# Patient Record
Sex: Female | Born: 1970 | Race: Asian | Hispanic: No | Marital: Married | Smoking: Never smoker
Health system: Southern US, Community
[De-identification: ages and names within clinical notes are randomized; demographics above are authoritative.]

## PROBLEM LIST (undated history)

## (undated) DIAGNOSIS — E785 Hyperlipidemia, unspecified: Secondary | ICD-10-CM

## (undated) DIAGNOSIS — C801 Malignant (primary) neoplasm, unspecified: Secondary | ICD-10-CM

## (undated) DIAGNOSIS — K649 Unspecified hemorrhoids: Secondary | ICD-10-CM

## (undated) DIAGNOSIS — Z800 Family history of malignant neoplasm of digestive organs: Secondary | ICD-10-CM

## (undated) HISTORY — PX: NO PAST SURGERIES: SHX2092

## (undated) HISTORY — DX: Hyperlipidemia, unspecified: E78.5

## (undated) HISTORY — DX: Family history of malignant neoplasm of digestive organs: Z80.0

---

## 2002-12-28 ENCOUNTER — Inpatient Hospital Stay (HOSPITAL_COMMUNITY): Admission: AD | Admit: 2002-12-28 | Discharge: 2002-12-31 | Payer: Self-pay | Admitting: Obstetrics

## 2004-07-14 ENCOUNTER — Ambulatory Visit (HOSPITAL_COMMUNITY): Admission: RE | Admit: 2004-07-14 | Discharge: 2004-07-14 | Payer: Self-pay | Admitting: Obstetrics

## 2005-01-15 ENCOUNTER — Inpatient Hospital Stay (HOSPITAL_COMMUNITY): Admission: AD | Admit: 2005-01-15 | Discharge: 2005-01-18 | Payer: Self-pay | Admitting: Obstetrics

## 2010-02-18 ENCOUNTER — Ambulatory Visit: Payer: Self-pay | Admitting: Internal Medicine

## 2010-02-18 LAB — CONVERTED CEMR LAB
AST: 21 units/L (ref 0–37)
Albumin: 3.9 g/dL (ref 3.5–5.2)
Bilirubin Urine: NEGATIVE
Calcium: 8.9 mg/dL (ref 8.4–10.5)
Cholesterol: 276 mg/dL — ABNORMAL HIGH (ref 0–200)
Creatinine, Ser: 0.7 mg/dL (ref 0.4–1.2)
Eosinophils Relative: 1.7 % (ref 0.0–5.0)
Glucose, Bld: 88 mg/dL (ref 70–99)
HDL: 65.4 mg/dL (ref >39.00)
Ketones, ur: NEGATIVE mg/dL
Leukocytes, UA: NEGATIVE
Lymphocytes Relative: 41 % (ref 12.0–46.0)
Monocytes Relative: 7.5 % (ref 3.0–12.0)
Neutrophils Relative %: 49.1 % (ref 43.0–77.0)
Platelets: 346 10*3/uL (ref 150.0–400.0)
Potassium: 4 meq/L (ref 3.5–5.1)
Sodium: 137 meq/L (ref 135–145)
Specific Gravity, Urine: 1.02 (ref 1.000–1.030)
TSH: 23.02 microintl units/mL — ABNORMAL HIGH (ref 0.35–5.50)
Total Bilirubin: 0.8 mg/dL (ref 0.3–1.2)
Total Protein, Urine: NEGATIVE mg/dL
Triglycerides: 169 mg/dL — ABNORMAL HIGH (ref 0.0–149.0)
Urine Glucose: NEGATIVE mg/dL
Urobilinogen, UA: 0.2 (ref 0.0–1.0)

## 2010-03-01 ENCOUNTER — Ambulatory Visit: Payer: Self-pay | Admitting: Internal Medicine

## 2010-03-01 DIAGNOSIS — E039 Hypothyroidism, unspecified: Secondary | ICD-10-CM | POA: Insufficient documentation

## 2010-03-01 DIAGNOSIS — E785 Hyperlipidemia, unspecified: Secondary | ICD-10-CM

## 2010-03-29 ENCOUNTER — Ambulatory Visit: Payer: Self-pay | Admitting: Internal Medicine

## 2010-04-26 ENCOUNTER — Ambulatory Visit: Payer: Self-pay | Admitting: Internal Medicine

## 2011-01-12 NOTE — Assessment & Plan Note (Signed)
Summary: NEW PT CPX / [employer] /[employer]  #   Vital Signs:  Patient profile:   40 year old female Height:      62 inches Weight:      124.50 pounds BMI:     22.85 O2 Sat:      97 % on Room air Temp:     98.2 degrees F oral Pulse rate:   97 / minute BP sitting:   104 / 80  (left arm) Cuff size:   regular  Vitals Entered ByZella Ball Ewing (March 01, 2010 9:24 AM)  O2 Flow:  Room air  CC: New Pt, CPX/RE   CC:  New Pt and CPX/RE.  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    Problems Prior to Update: None  Medications Prior to Update: 1)  None  Current Medications (verified): 1)  Levothyroxine Sodium 50 Mcg Tabs (Levothyroxine Sodium) .Marland Kitchen.. 1 By Mouth Once Daily  Allergies (verified): No Known Drug Allergies  Past History:  Family History: Last updated: 03/01/2010 family in [place] - healthy but she does know them well  Social History: Last updated: 03/01/2010 from [place] - 1996 Married 3 children work - Press photographer - makes socks Never Smoked Alcohol use-no Drug use-no  Risk Factors: Smoking Status: never (03/01/2010)  Past Medical History: Hyperlipidemia Hypothyroidism  Past Surgical History: Denies surgical history  Family History: Reviewed history and no changes required. family in [place] - healthy but she does know them well  Social History: Reviewed history and no changes required. from [place] - 1996 Married 3 children work - Press photographer - makes socks Never Smoked Alcohol use-no Drug use-no Smoking Status:  never Drug Use:  no  Review of  Systems  The patient denies anorexia, fever, weight loss, vision loss, decreased hearing, hoarseness, chest pain, syncope, dyspnea on exertion, peripheral edema, prolonged cough, headaches, hemoptysis, abdominal pain, melena, hematochezia, severe indigestion/heartburn, hematuria, incontinence, muscle weakness, suspicious skin lesions, transient blindness, difficulty walking, depression, unusual weight change, abnormal bleeding, enlarged lymph nodes, and angioedema.         all otherwise negative per pt -    Physical Exam  General:  alert and well-developed. , but thin for height Head:  normocephalic and atraumatic.   Eyes:  vision grossly intact, pupils equal, and pupils round.   Ears:  R ear normal and L ear normal.   Nose:  no external deformity and no nasal discharge.   Mouth:  no gingival abnormalities and pharynx pink and moist.   Neck:  supple and no masses.   Lungs:  normal respiratory effort and normal breath sounds.   Heart:  normal rate and regular rhythm.   Abdomen:  soft, non-tender, and normal bowel sounds.   Msk:  no joint tenderness and no joint swelling.   Extremities:  no edema, no erythema  Neurologic:  cranial nerves II-XII intact and strength normal in all extremities.     Impression & Recommendations:  Problem # 1:  Preventive Health Care (ICD-V70.0) Overall doing well, age appropriate education and counseling updated and referral for appropriate preventive  services done unless declined, immunizations up to date or declined, diet counseling done if overweight, urged to quit smoking if smokes , most recent labs reviewed and current ordered if appropriate, ecg reviewed or declined (interpretation per ECG scanned in the EMR if done); information regarding Medicare Prevention requirements given if appropriate , for tetanus today  Problem # 2:  HYPOTHYROIDISM (ICD-244.9)  to start levothyroxin , f/u lab 4 wks  Her updated medication list for this problem includes:     Levothyroxine Sodium 50 Mcg Tabs (Levothyroxine sodium) .Marland Kitchen... 1 by mouth once daily  Labs Reviewed: TSH: 23.02 (02/18/2010)    Chol: 276 (02/18/2010)   HDL: 65.40 (02/18/2010)   TG: 169.0 (02/18/2010)  Problem # 3:  HYPERLIPIDEMIA (ICD-272.4)  most likely more elev than usual due to low thyroid;  to correct the thyroid function, and low chol diet, declines diet referral at this itme  Labs Reviewed: SGOT: 21 (02/18/2010)   SGPT: 19 (02/18/2010)   HDL:65.40 (02/18/2010)  Chol:276 (02/18/2010)  Trig:169.0 (02/18/2010)  Complete Medication List: 1)  Levothyroxine Sodium 50 Mcg Tabs (Levothyroxine sodium) .Marland Kitchen.. 1 by mouth once daily  Patient Instructions: 1)  please call your GYN so that you can have your yearly pap smear 2)  please follow lower cholesterol diet 3)  you had the tetanus shot today 4)  Please take all new medications as prescribed - the thyroid pill 5)  please return in 4 wks for LAB only:  TSH  244.8 6)  Please schedule a follow-up appointment in 1 year or sooner if needed Prescriptions: LEVOTHYROXINE SODIUM 50 MCG TABS (LEVOTHYROXINE SODIUM) 1 by mouth once daily  #90 x 3   Entered and Authorized by:   Corwin Levins MD   Signed by:   Corwin Levins MD on 03/01/2010   Method used:   Print then Give to Patient   RxID:   [id number]   Appended Document: Immunization Entry      Immunizations Administered:  Tetanus Vaccine:    Vaccine Type: Tdap    Site: left deltoid    Mfr: GlaxoSmithKline    Dose: 0.5 ml    Route: IM    Given by: Robin Ewing    Exp. Date: 10/06/2010    Lot #: [id number]    VIS given: 10/29/07 version given March 01, 2010.

## 2011-01-12 NOTE — Miscellaneous (Signed)
Summary: [facility]   Imported By: Lester Richlands 03/11/2010 08:48:01  _____________________________________________________________________  External Attachment:    Type:   Image     Comment:   External Document

## 2011-04-28 NOTE — Consult Note (Signed)
NAMEISHIKA, [place]                ACCOUNT NO.:  [id number]   MEDICAL RECORD NO.:  [id number]          PATIENT TYPE:  INP   LOCATION:  9128                          FACILITY:  [facility]   PHYSICIAN:  Pramod P. Pearlean Brownie, MD    DATE OF BIRTH:  1971-01-15   DATE OF CONSULTATION:  DATE OF DISCHARGE:                                   CONSULTATION   REFERRING PHYSICIAN:  Kathreen Cosier, M.D.   REASON FOR CONSULTATION:  Fall.   HISTORY OF PRESENT ILLNESS:  Gabrielle Robertson is a 40 year old [place] female who  delivered an infant earlier this morning at 4 a.m.  In the immediate  postpartum period, she was given Stadol, Percocet and Phenergan.  She at  about 6:15 this a.m. had a witnessed fall while walking to the bathroom.  The patient does not remember this, but apparently there was no significant  loss of consciousness, head injury, headache or witnessed seizure activity.  Her blood pressure recorded at that time was 61 systolic.  Subsequently she  got up again at 8:30 and while walking to the bathroom fell down again, and  at this time the blood pressure recorded as 87 systolic.  The patient is  unable to describe this event but as per a description from the nurses,  there was no focal tonic clonic activity  noted and she recovered  immediately and was not confused or disoriented.  There is no prior history  of seizures, headaches or significant neurological problems.   PAST MEDICAL HISTORY:  Unremarkable.   HOME MEDICATIONS:  None.   MEDICATION ALLERGIES:  None.   FAMILY HISTORY:  Not significant for anybody with seizures.   SOCIAL HISTORY:  She is married, lives with her husband and two children.   REVIEW OF SYSTEMS:  Not significant for any major illness except recent  delivery.   PHYSICAL EXAMINATION:  GENERAL:  A young [place] female, not in distress.  VITAL SIGNS:  Afebrile, pulse rate is 100 per minute, regular, respiratory  rate 16 per minute, distal pulses well-felt, blood  pressure is 108/70.  She  is not orthostatic and able to ambulate well now.  Respiratory rate 20 per  minute.  HEENT:  Head is nontraumatic.  ENT exam unremarkable.  NECK:  Supple without bruit.  CARDIAC:  No murmur or gallops.  CHEST: Lungs clear to auscultation.  NEUROLOGIC:  The patient awake, alert and oriented x3.  Normal speech and  language function.  There is no aphasia, apraxia or dysarthria.  Pupils are  equally reactive to light and accommodation.  Face is symmetric, bilateral  movements are normal, tongue is midline.  Motor system exam reveals  symmetric upper and lower extremity strength, tone, reflexes, coordination  and sensation.  She can walk with a steady gait.   DATA REVIEWED:  White count 8.9, hemoglobin 8.4, hematocrit is 12.4,  platelets 286.  CBG is 144.   IMPRESSION:  A 40 year old lady with two episodes of brief fall and loss of  consciousness, likely secondary to syncopal events secondary to her  hypotension, which is secondary to  her pain medications as well as  postpartum state.  I doubt that this represents a seizure or significant  neurological event.   I would recommend optimizing caloric and hydration status, gradual  ambulation as tolerated, and minimizing CNS-acting and deeply lowering pain  medications.  I do not believe further diagnostic neurological workup is  necessary at the present time.  If she continues to have further episodes,  she may electively follow up as an outpatient.   Thank you for the referral.      PPS/MEDQ  D:  01/16/2005  T:  01/16/2005  Job:  [id number]

## 2012-06-14 ENCOUNTER — Ambulatory Visit (INDEPENDENT_AMBULATORY_CARE_PROVIDER_SITE_OTHER): Payer: BC Managed Care – PPO | Admitting: Emergency Medicine

## 2012-06-14 VITALS — BP 106/80 | HR 57 | Temp 98.1°F | Resp 16 | Ht 60.25 in | Wt 118.0 lb

## 2012-06-14 DIAGNOSIS — Z0000 Encounter for general adult medical examination without abnormal findings: Secondary | ICD-10-CM

## 2012-06-14 DIAGNOSIS — R87619 Unspecified abnormal cytological findings in specimens from cervix uteri: Secondary | ICD-10-CM

## 2012-06-14 LAB — POCT UA - MICROSCOPIC ONLY
Casts, Ur, LPF, POC: NEGATIVE
Crystals, Ur, HPF, POC: NEGATIVE
Yeast, UA: NEGATIVE

## 2012-06-14 LAB — POCT URINALYSIS DIPSTICK
Bilirubin, UA: NEGATIVE
Bilirubin, UA: NEGATIVE
Glucose, UA: NEGATIVE
Glucose, UA: NEGATIVE
Ketones, UA: NEGATIVE
Ketones, UA: NEGATIVE
Leukocytes, UA: NEGATIVE
Leukocytes, UA: NEGATIVE
Nitrite, UA: NEGATIVE
Protein, UA: NEGATIVE
Protein, UA: NEGATIVE
Spec Grav, UA: 1.02
Urobilinogen, UA: 0.2
pH, UA: 5.5
pH, UA: 5.5

## 2012-06-14 LAB — POCT CBC
HCT, POC: 44.6 % (ref 37.7–47.9)
MCH, POC: 31.8 pg — AB (ref 27–31.2)
MCHC: 32.1 g/dL (ref 31.8–35.4)
MCV: 99.4 fL — AB (ref 80–97)
POC Granulocyte: 3.5 (ref 2–6.9)
POC LYMPH PERCENT: 36.9 %L (ref 10–50)
POC MID %: 6.7 %M (ref 0–12)

## 2012-06-14 NOTE — Progress Notes (Signed)
    Patient Name: Gabrielle Robertson Date of Birth: 12/07/1971 Medical Record Number: [id number] Gender: female Date of Encounter: 06/14/2012  Chief Complaint: Annual Exam   History of Present Illness:  Gabrielle Robertson is a 41 y.o. very pleasant female patient who presents with the following:  No current complaints.  For annual examination   Patient Active Problem List  Diagnosis  . HYPOTHYROIDISM  . HYPERLIPIDEMIA   No past medical history on file. No past surgical history on file. History  Substance Use Topics  . Smoking status: Never Smoker   . Smokeless tobacco: Not on file  . Alcohol Use: Not on file   No family history on file. No Known Allergies  Medication list has been reviewed and updated.  No current outpatient prescriptions on file prior to visit.    Review of Systems:  As per HPI, otherwise negative.    Physical Examination: Filed Vitals:   06/14/12 0926  BP: 106/80  Pulse: 57  Temp: 98.1 F (36.7 C)  Resp: 16   Filed Vitals:   06/14/12 0926  Height: 5' 0.25" (1.53 m)  Weight: 118 lb (53.524 kg)   Body mass index is 22.85 kg/(m^2). Ideal Body Weight: Weight in (lb) to have BMI = 25: 128.8   GEN: WDWN, NAD, Non-toxic, A & O x 3 HEENT: Atraumatic, Normocephalic. Neck supple. No masses, No LAD. Ears and Nose: No external deformity. CV: RRR, No M/G/R. No JVD. No thrill. No extra heart sounds. PULM: CTA B, no wheezes, crackles, rhonchi. No retractions. No resp. distress. No accessory muscle use. ABD: S, NT, ND, +BS. No rebound. No HSM. EXTR: No c/c/e NEURO Normal gait.  PSYCH: Normally interactive. Conversant. Not depressed or anxious appearing.  Calm demeanor.  Pelvic exam: normal external genitalia, vulva, vagina, cervix, uterus and adnexa.  EKG / Labs / Xrays: None available at time of encounter  Assessment and Plan: Labs pending Pap   Carmelina Dane, MD

## 2012-06-15 LAB — COMPREHENSIVE METABOLIC PANEL
ALT: 31 U/L (ref 0–35)
Alkaline Phosphatase: 41 U/L (ref 39–117)
BUN: 15 mg/dL (ref 6–23)
Calcium: 9.2 mg/dL (ref 8.4–10.5)
Creat: 0.88 mg/dL (ref 0.50–1.10)
Potassium: 4.2 mEq/L (ref 3.5–5.3)
Sodium: 137 mEq/L (ref 135–145)
Total Bilirubin: 0.7 mg/dL (ref 0.3–1.2)
Total Protein: 8 g/dL (ref 6.0–8.3)

## 2012-06-15 LAB — VITAMIN D 25 HYDROXY (VIT D DEFICIENCY, FRACTURES): Vit D, 25-Hydroxy: 16 ng/mL — ABNORMAL LOW (ref 30–89)

## 2012-06-15 LAB — LIPID PANEL
HDL: 54 mg/dL (ref >39)
Total CHOL/HDL Ratio: 4.7 Ratio
Triglycerides: 119 mg/dL (ref <150)
VLDL: 24 mg/dL (ref 0–40)

## 2012-06-19 LAB — PAP IG, CT-NG, RFX HPV ASCU: Chlamydia Probe Amp: NEGATIVE

## 2012-06-21 ENCOUNTER — Encounter: Payer: Self-pay | Admitting: Emergency Medicine

## 2012-06-21 ENCOUNTER — Other Ambulatory Visit: Payer: Self-pay | Admitting: Emergency Medicine

## 2012-06-21 MED ORDER — VITAMIN D (ERGOCALCIFEROL) 1.25 MG (50000 UNIT) PO CAPS: 50000.0000 [IU] | ORAL_CAPSULE | ORAL | Status: DC

## 2012-12-10 ENCOUNTER — Ambulatory Visit (INDEPENDENT_AMBULATORY_CARE_PROVIDER_SITE_OTHER): Payer: BC Managed Care – PPO | Admitting: Emergency Medicine

## 2012-12-10 VITALS — BP 118/76 | HR 82 | Temp 99.0°F | Resp 16 | Ht 65.25 in | Wt 118.8 lb

## 2012-12-10 DIAGNOSIS — Z139 Encounter for screening, unspecified: Secondary | ICD-10-CM

## 2012-12-10 DIAGNOSIS — E039 Hypothyroidism, unspecified: Secondary | ICD-10-CM

## 2012-12-10 DIAGNOSIS — Z1231 Encounter for screening mammogram for malignant neoplasm of breast: Secondary | ICD-10-CM

## 2012-12-10 DIAGNOSIS — E782 Mixed hyperlipidemia: Secondary | ICD-10-CM

## 2012-12-10 DIAGNOSIS — E785 Hyperlipidemia, unspecified: Secondary | ICD-10-CM

## 2012-12-10 DIAGNOSIS — Z0000 Encounter for general adult medical examination without abnormal findings: Secondary | ICD-10-CM

## 2012-12-10 LAB — POCT CBC
Granulocyte percent: 60.8 %G (ref 37–80)
MCH, POC: 30.4 pg (ref 27–31.2)
MCHC: 30.8 g/dL — AB (ref 31.8–35.4)
MID (cbc): 0.3 (ref 0–0.9)
MPV: 9.3 fL (ref 0–99.8)
WBC: 5.9 10*3/uL (ref 4.6–10.2)

## 2012-12-10 LAB — LIPID PANEL
HDL: 49 mg/dL (ref >39)
Total CHOL/HDL Ratio: 4.4 Ratio
Triglycerides: 166 mg/dL — ABNORMAL HIGH (ref <150)

## 2012-12-10 LAB — T4, FREE: Free T4: 0.62 ng/dL — ABNORMAL LOW (ref 0.80–1.80)

## 2012-12-10 NOTE — Progress Notes (Signed)
  Subjective:    Patient ID: Gabrielle Robertson, female    DOB: May 14, 1971, 41 y.o.   MRN: [id number]  HPI Pt here for CPE. Her job is requiring her to get one. SHe has hx of hyperlipidemia. Patient actually stated she wanted a complete physical but after discussing these issues with the patient it appears she had a Pap smear done this summer. She had blood work done where she works and was found to have a cholesterol 300. She is not on medication for this. She has birth control pills at home but has not been using them. She has never had a mammogram. She has no family history of colon cancer or breast cancer    Review of Systems     Objective:   Physical Exam HEENT exam is within normal limits. Neck is supple. Chest is clear to auscultation and percussion. Heart is regular rate without murmurs rubs or gallops. Abdomen is soft liver spleen not enlarged there is no tenderness. Extremities are without edema.        Assessment & Plan:    Maryclare Labrador go ahead and check her thyroid and she has a history of hypothyroidism. We'll go ahead and repeat her cholesterol. If it is elevated would suggest Lipitor 20 one a day along with a low-fat diet. CBC  was done today and she will be scheduled for a mammogram.

## 2013-01-15 ENCOUNTER — Ambulatory Visit
Admission: RE | Admit: 2013-01-15 | Discharge: 2013-01-15 | Disposition: A | Discharge status: Home / Self Care | Payer: BC Managed Care – PPO | Source: Ambulatory Visit | Attending: Emergency Medicine | Admitting: Emergency Medicine

## 2013-01-15 DIAGNOSIS — Z1231 Encounter for screening mammogram for malignant neoplasm of breast: Secondary | ICD-10-CM

## 2020-06-26 ENCOUNTER — Ambulatory Visit (HOSPITAL_COMMUNITY)
Admission: EM | Admit: 2020-06-26 | Discharge: 2020-06-26 | Disposition: A | Discharge status: Home / Self Care | Payer: BC Managed Care – PPO

## 2020-06-26 ENCOUNTER — Other Ambulatory Visit: Payer: Self-pay

## 2020-06-26 ENCOUNTER — Encounter (HOSPITAL_COMMUNITY): Payer: Self-pay | Admitting: *Deleted

## 2020-06-26 DIAGNOSIS — M5441 Lumbago with sciatica, right side: Secondary | ICD-10-CM | POA: Diagnosis not present

## 2020-06-26 DIAGNOSIS — M62830 Muscle spasm of back: Secondary | ICD-10-CM

## 2020-06-26 MED ORDER — NAPROXEN 500 MG PO TABS: 500.0000 mg | ORAL_TABLET | Freq: Two times a day (BID) | ORAL | 0 refills | Status: AC

## 2020-06-26 MED ORDER — TIZANIDINE HCL 4 MG PO TABS: 4.0000 mg | ORAL_TABLET | Freq: Every day | ORAL | 0 refills | Status: AC

## 2020-06-26 NOTE — Discharge Instructions (Signed)
Stop the other medications you have been taking  Take the naproxen 2 times a day for up to 14 days, if improving use as needed  Take the muscle relaxer, tizanidine/zanaflex at night, this will make you sleepy, do not drive, drink alcohol or operate machinery within 8 hours of taking  Call the internal medicine center Monday to establish with a primary care provider  Call sports medicine group if symptoms not improving over next 3-5 days  If difficulty with urine, lose of sensation or weakness, return or go to the Emergency Department

## 2020-06-26 NOTE — ED Triage Notes (Signed)
Denies injury.  C/O pain across low back - R>L; radiating down into right buttock and leg.  Denies parasthesias.  States pain worsening since onset Wed.

## 2020-06-26 NOTE — ED Provider Notes (Signed)
[facility]    CSN: [id number] Arrival date & time: 06/26/20  1017      History   Chief Complaint Chief Complaint  Patient presents with  . Back Pain    HPI Jordayn Mink is a 49 y.o. female.   Patient presents for low back pain. She reports it hurts on both sides of her low back but more on the right. She reports pain shoots down her right leg at times. Pain is worse with bending motions. No pain with walking. She reports symptoms started 3-4 days ago and have gradually increased. She reports she lifts things at work frequently. Denies a single inciting event. No numbness, weakness or tingling. Control bowel and bladder without issue. No saddle paratheisa. No urinary symptoms.No fevers or chills. No history of trauma. No abdominal pain.   Patient reveals to me her correct age is closer to 48-54, her birth age was never changed after immigration to [place]. She has not had a menstrual cycle in several years.      History reviewed. No pertinent past medical history.  Patient Active Problem List   Diagnosis Date Noted  . HYPOTHYROIDISM 03/01/2010  . HYPERLIPIDEMIA 03/01/2010    History reviewed. No pertinent surgical history.  OB History   No obstetric history on file.      Home Medications    Prior to Admission medications   Medication Sig Start Date End Date Taking? Authorizing Provider  Ascorbic Acid (VITAMIN C PO) Take by mouth.   Yes [provider]  naproxen (NAPROSYN) 500 MG tablet Take 1 tablet (500 mg total) by mouth 2 (two) times daily with a meal for 14 days. 06/26/20 07/10/20  Rocko Fesperman, Marguerita Beards, PA-C  tiZANidine (ZANAFLEX) 4 MG tablet Take 1 tablet (4 mg total) by mouth at bedtime for 14 days. 06/26/20 07/10/20  Saraiyah Hemminger, Marguerita Beards, PA-C  Vitamin D, Ergocalciferol, (DRISDOL) 50000 UNITS CAPS Take 1 capsule (50,000 Units total) by mouth every 7 (seven) days. 06/21/12   Roselee Culver, MD    Family History History reviewed. No pertinent family  history.  Social History Social History   Tobacco Use  . Smoking status: Never Smoker  . Smokeless tobacco: Never Used  Vaping Use  . Vaping Use: Never used  Substance Use Topics  . Alcohol use: No  . Drug use: No     Allergies   Patient has no known allergies.   Review of Systems Review of Systems   Physical Exam Triage Vital Signs ED Triage Vitals  Enc Vitals Group     BP 06/26/20 1127 (!) 159/94     Pulse Rate 06/26/20 1127 73     Resp 06/26/20 1127 14     Temp 06/26/20 1127 98.2 F (36.8 C)     Temp Source 06/26/20 1127 Oral     SpO2 06/26/20 1127 99 %     Weight --      Height --      Head Circumference --      Peak Flow --      Pain Score 06/26/20 1128 6     Pain Loc --      Pain Edu? --      Excl. in [place]? --    No data found.  Updated Vital Signs BP (!) 141/92   Pulse 73   Temp 98.2 F (36.8 C) (Oral)   Resp 14   LMP 11/27/2012   SpO2 99%   Visual Acuity Right Eye Distance:  Left Eye Distance:   Bilateral Distance:    Right Eye Near:   Left Eye Near:    Bilateral Near:     Physical Exam Vitals and nursing note reviewed.  Constitutional:      General: She is not in acute distress.    Appearance: She is well-developed.  HENT:     Head: Normocephalic and atraumatic.  Eyes:     Conjunctiva/sclera: Conjunctivae normal.  Cardiovascular:     Rate and Rhythm: Normal rate.  Pulmonary:     Effort: Pulmonary effort is normal. No respiratory distress.  Musculoskeletal:     Cervical back: Neck supple.     Comments: TTP of bilateral paralumbar spinal musculature, R>L. No midline TTP throughout the spine. No step offs or deviation. SLR + Right  Patient ambulatory without issue  5/5 strength in lower extremity, equal bilaterally. Sensation intact, equal bilat in LE  Mild glutearl TTP on right, no thigh or hamstring TTP. Full ROM at hip bilaterally without issue.   Skin:    General: Skin is warm and dry.  Neurological:     General: No  focal deficit present.     Mental Status: She is alert and oriented to person, place, and time.     Sensory: No sensory deficit.     Motor: No weakness.     Coordination: Coordination normal.     Gait: Gait normal.      UC Treatments / Results  Labs (all labs ordered are listed, but only abnormal results are displayed) Labs Reviewed - No data to display  EKG   Radiology No results found.  Procedures Procedures (including critical care time)  Medications Ordered in UC Medications - No data to display  Initial Impression / Assessment and Plan / UC Course  I have reviewed the triage vital signs and the nursing notes.  Pertinent labs & imaging results that were available during my care of the patient were reviewed by me and considered in my medical decision making (see chart for details).     #Low back pain with sciatica on Right side #muscle spasm Patient is a 49 year old otherwise healthy female with acute low back pain with sciatica. No red flags today. No indication for imaging today. Will trial NSAIDs and muscle relaxer. Patient does not have PCP, highly encouraged she establish care for follow up and routine care, resource given. Also discussed if lack of improvement to return or follow up with sports medicine group. ED precautions discussed. Patient verbalized understanding plan of care.  Final Clinical Impressions(s) / UC Diagnoses   Final diagnoses:  Acute bilateral low back pain with right-sided sciatica  Muscle spasm of back     Discharge Instructions     Stop the other medications you have been taking  Take the naproxen 2 times a day for up to 14 days, if improving use as needed  Take the muscle relaxer, tizanidine/zanaflex at night, this will make you sleepy, do not drive, drink alcohol or operate machinery within 8 hours of taking  Call the internal medicine center Monday to establish with a primary care provider  Call sports medicine group if symptoms  not improving over next 3-5 days  If difficulty with urine, lose of sensation or weakness, return or go to the Emergency Department      ED Prescriptions    Medication Sig Dispense Auth. Provider   naproxen (NAPROSYN) 500 MG tablet Take 1 tablet (500 mg total) by mouth 2 (two) times  daily with a meal for 14 days. 28 tablet Karesha Trzcinski, Marguerita Beards, PA-C   tiZANidine (ZANAFLEX) 4 MG tablet Take 1 tablet (4 mg total) by mouth at bedtime for 14 days. 14 tablet Tena Linebaugh, Marguerita Beards, PA-C     PDMP not reviewed this encounter.   Purnell Shoemaker, PA-C 06/27/20 1204

## 2020-12-11 HISTORY — PX: SIGMOIDECTOMY: SHX176

## 2021-03-17 ENCOUNTER — Other Ambulatory Visit: Payer: Self-pay

## 2021-03-17 ENCOUNTER — Ambulatory Visit (HOSPITAL_COMMUNITY)
Admission: EM | Admit: 2021-03-17 | Discharge: 2021-03-17 | Disposition: A | Discharge status: Home / Self Care | Payer: BC Managed Care – PPO | Attending: Emergency Medicine | Admitting: Emergency Medicine

## 2021-03-17 DIAGNOSIS — K625 Hemorrhage of anus and rectum: Secondary | ICD-10-CM | POA: Diagnosis present

## 2021-03-17 DIAGNOSIS — K649 Unspecified hemorrhoids: Secondary | ICD-10-CM | POA: Insufficient documentation

## 2021-03-17 LAB — CBC WITH DIFFERENTIAL/PLATELET
Abs Immature Granulocytes: 0.01 10*3/uL (ref 0.00–0.07)
Basophils Absolute: 0.1 10*3/uL (ref 0.0–0.1)
Basophils Relative: 1 %
Eosinophils Absolute: 0.2 10*3/uL (ref 0.0–0.5)
Eosinophils Relative: 3 %
HCT: 38.1 % (ref 36.0–46.0)
Hemoglobin: 12.9 g/dL (ref 12.0–15.0)
Immature Granulocytes: 0 %
Lymphocytes Relative: 40 %
Lymphs Abs: 2.4 10*3/uL (ref 0.7–4.0)
MCH: 30.7 pg (ref 26.0–34.0)
MCHC: 33.9 g/dL (ref 30.0–36.0)
MCV: 90.7 fL (ref 80.0–100.0)
Monocytes Absolute: 0.5 10*3/uL (ref 0.1–1.0)
Monocytes Relative: 8 %
Neutro Abs: 2.9 10*3/uL (ref 1.7–7.7)
Neutrophils Relative %: 48 %
Platelets: 469 10*3/uL — ABNORMAL HIGH (ref 150–400)
RBC: 4.2 MIL/uL (ref 3.87–5.11)
RDW: 12 % (ref 11.5–15.5)
WBC: 6 10*3/uL (ref 4.0–10.5)
nRBC: 0 % (ref 0.0–0.2)

## 2021-03-17 NOTE — ED Triage Notes (Signed)
Blood in stools first saw blood last Friday and today.

## 2021-03-17 NOTE — Discharge Instructions (Signed)
You can use preparation H, sitz bath, or witch hazel as needed for rectal discomfort.  Follow up with Gastroenterology for evaluation.    Follow up with your primary care or go to the emergency department if symptoms do not improve or worsen over the next few days.

## 2021-03-17 NOTE — ED Provider Notes (Signed)
[facility]    CSN: [id number] Arrival date & time: 03/17/21  1025      History   Chief Complaint Chief Complaint  Patient presents with  . Rectal Bleeding    HPI Gabrielle Robertson is a 50 y.o. female.   Patient is here for evaluation of blood in stool.  Reports first noticed blood streaking stool on Friday and again this morning.  Denies any dark or tarry stools.  Denies any difficulty defecating or hard stools.  Reports having history of hemorrhoids.  Denies any history of rectal pain or bleeding.  Denies any current rectal pain. Denies any specific alleviating or aggravating factors.  Denies any fevers, chest pain, shortness of breath, N/V/D, numbness, tingling, weakness, abdominal pain, or headaches.   ROS: As per HPI, all other pertinent ROS negative   The history is provided by the patient.  Rectal Bleeding Quality:  Bright red Amount:  Scant Timing:  Sporadic Context: hemorrhoids   Associated symptoms: no abdominal pain, no dizziness, no epistaxis, no fever, no hematemesis, no light-headedness, no loss of consciousness, no recent illness and no vomiting   Risk factors: no anticoagulant use     No past medical history on file.  Patient Active Problem List   Diagnosis Date Noted  . HYPOTHYROIDISM 03/01/2010  . HYPERLIPIDEMIA 03/01/2010    No past surgical history on file.  OB History   No obstetric history on file.      Home Medications    Prior to Admission medications   Medication Sig Start Date End Date Taking? Authorizing Provider  Ascorbic Acid (VITAMIN C PO) Take by mouth.    [provider]  Vitamin D, Ergocalciferol, (DRISDOL) 50000 UNITS CAPS Take 1 capsule (50,000 Units total) by mouth every 7 (seven) days. 06/21/12   Roselee Culver, MD    Family History No family history on file.  Social History Social History   Tobacco Use  . Smoking status: Never Smoker  . Smokeless tobacco: Never Used  Vaping Use  . Vaping Use:  Never used  Substance Use Topics  . Alcohol use: No  . Drug use: No     Allergies   Patient has no known allergies.   Review of Systems Review of Systems  Constitutional: Negative for fever.  HENT: Negative for nosebleeds.   Gastrointestinal: Positive for blood in stool and hematochezia. Negative for abdominal pain, hematemesis and vomiting.  Neurological: Negative for dizziness, loss of consciousness and light-headedness.  All other systems reviewed and are negative.    Physical Exam Triage Vital Signs ED Triage Vitals  Enc Vitals Group     BP 03/17/21 0934 (!) 145/93     Pulse Rate 03/17/21 0934 80     Resp 03/17/21 0934 18     Temp 03/17/21 0934 98.3 F (36.8 C)     Temp Source 03/17/21 0934 Oral     SpO2 --      Weight --      Height --      Head Circumference --      Peak Flow --      Pain Score 03/17/21 0932 0     Pain Loc --      Pain Edu? --      Excl. in [place]? --    No data found.  Updated Vital Signs BP (!) 145/93 (BP Location: Right Arm)   Pulse 80   Temp 98.3 F (36.8 C) (Oral)   Resp 18   LMP  11/27/2012   Visual Acuity Right Eye Distance:   Left Eye Distance:   Bilateral Distance:    Right Eye Near:   Left Eye Near:    Bilateral Near:     Physical Exam Vitals and nursing note reviewed.  Constitutional:      General: She is not in acute distress.    Appearance: Normal appearance. She is not ill-appearing, toxic-appearing or diaphoretic.  HENT:     Head: Normocephalic and atraumatic.  Eyes:     Conjunctiva/sclera: Conjunctivae normal.  Cardiovascular:     Rate and Rhythm: Normal rate.     Pulses: Normal pulses.  Pulmonary:     Effort: Pulmonary effort is normal.  Abdominal:     General: Abdomen is flat.     Palpations: Abdomen is soft.  Musculoskeletal:        General: Normal range of motion.     Cervical back: Normal range of motion.  Skin:    General: Skin is warm and dry.  Neurological:     General: No focal deficit  present.     Mental Status: She is alert and oriented to person, place, and time.  Psychiatric:        Mood and Affect: Mood normal.      UC Treatments / Results  Labs (all labs ordered are listed, but only abnormal results are displayed) Labs Reviewed  CBC WITH DIFFERENTIAL/PLATELET    EKG   Radiology No results found.  Procedures Procedures (including critical care time)  Medications Ordered in UC Medications - No data to display  Initial Impression / Assessment and Plan / UC Course  I have reviewed the triage vital signs and the nursing notes.  Pertinent labs & imaging results that were available during my care of the patient were reviewed by me and considered in my medical decision making (see chart for details).     Estimate negative for red flags or concerns including gastrointestinal hemorrhage.  Will obtain and check a CBC to ensure that hemoglobin is normal. Recommend using Preparation H, sitz bath, or witch hazel as needed for comfort.  Increase fiber intake and use a stool softener for hard or difficult bowel movements. Recommend following up with gastroenterology for evaluation and possible colonoscopy.  Final Clinical Impressions(s) / UC Diagnoses   Final diagnoses:  Hemorrhoids, unspecified hemorrhoid type  Rectal bleeding     Discharge Instructions     You can use preparation H, sitz bath, or witch hazel as needed for rectal discomfort.  Follow up with Gastroenterology for evaluation.    Follow up with your primary care or go to the emergency department if symptoms do not improve or worsen over the next few days.     ED Prescriptions    None     PDMP not reviewed this encounter.   Pearson Forster, NP 03/17/21 1014

## 2021-04-03 NOTE — Progress Notes (Signed)
04/03/2021 Gabrielle Robertson [id number] 12/19/1970   CHIEF COMPLAINT: Rectal bleeding   HISTORY OF PRESENT ILLNESS:  Gabrielle Robertson is a 50 year old female with a past medical history of hyperlipidemia otherwise noncontributory. No past surgical history. She was referred to our office by Caroll Rancher ED Nurse Practitioner for further evaluation regarding rectal bleeding. She is originally from [place], she speaks [place] and limited [place]. Her husband is present and he speaks [place] fairly well, however, I utilized the [employer] interpretor service to ensure accurate communication throughout today's consult. She complains of having painless bright red rectal bleeding daily since the end of March. No associated constipation, straining or anorectal pain. No abdominal pain. She presented to the urgent care  03/17/2021 due to having persistent rectal bleeding. Labs showed a Hg level of 12.9. HCT 38.1. PLT 468. She was assessed to have hemorrhoids and she was prescribed Preparation H, sitz bath and witch hazel and to schedule a GI evaluation.   She continues to see a small amount of bright red blood on the toilet tissue, on the stool and in the toilet water daily. No abdominal or anorectal pain. No weight loss. No known family history of colorectal cancer.  No other complaints at this time.  Social History: She is married.  She is originally from [place] and moved to the [place] in 1996.  She has 2 sons and 1 daughter.  Non-smoker.  No alcohol use.  No drug use.  Family History:  Mother deceased in 59 shot in during war. Father is living and reported as healthy.  She has 7 younger siblings who live in [place] without known significant health issues.  No know family history esophageal, gastric or colon cancer.   No Known Allergies   Outpatient Encounter Medications as of 04/04/2021  Medication Sig  . Ascorbic Acid (VITAMIN C PO) Take by mouth.  . Vitamin D, Ergocalciferol, (DRISDOL) 50000  UNITS CAPS Take 1 capsule (50,000 Units total) by mouth every 7 (seven) days.   No facility-administered encounter medications on file as of 04/04/2021.    REVIEW OF SYSTEMS:  Gen: Denies fever, sweats or chills. No weight loss.  CV: Denies chest pain, palpitations or edema. Resp: Denies cough, shortness of breath of hemoptysis.  GI: See HPI.  Denies heartburn, dysphagia, stomach or lower abdominal pain. No diarrhea or constipation.  GU : Denies urinary burning, blood in urine, increased urinary frequency or incontinence. MS: Denies joint pain, muscles aches or weakness. Derm: Denies rash, itchiness, skin lesions or unhealing ulcers. Psych: Denies depression, anxiety or memory issues. Heme: Denies bruising, bleeding. Neuro:  Denies headaches, dizziness or paresthesias. Endo:  Denies any problems with DM, thyroid or adrenal function.   PHYSICAL EXAM: LMP 11/27/2012   BP 112/70   Pulse 75   Ht 5' (1.524 m)   Wt 124 lb (56.2 kg)   LMP 11/27/2012   SpO2 99%   BMI 24.22 kg/m   General: 50 year old female in no acute distress. Head: Normocephalic and atraumatic. Eyes:  Sclerae non-icteric, conjunctive pink. Ears: Normal auditory acuity. Mouth: Dentition intact. No ulcers or lesions.  Neck: Supple, no lymphadenopathy or thyromegaly.  Lungs: Clear bilaterally to auscultation without wheezes, crackles or rhonchi. Heart: Regular rate and rhythm. No murmur, rub or gallop appreciated.  Abdomen: Soft, nontender, non distended. No masses. No hepatosplenomegaly. Normoactive bowel sounds x 4 quadrants.  Rectal: Small noninflamed anterior external hemorrhoids. Marble size mobile nontender mass right anorectum approximately 6cm  from the anal verge, not completely within reach of gloved exam finger, anoscopy utilized without visualizing the palpated mass, a small amount of darker red blood in the rectum somewhat obscured view. Anoscope removed without difficulty or excessive bleeding.   Musculoskeletal: Symmetrical with no gross deformities. Skin: Warm and dry. No rash or lesions on visible extremities. Extremities: No edema. Neurological: Alert oriented x 4, no focal deficits.  Psychological:  Alert and cooperative. Normal mood and affect.  ASSESSMENT AND PLAN:  10. 50 year old female with painless hematochezia. Small nontender marble sized mobile mass approximately 6 cm from the anal verge assessed on rectal exam with dark red blood in the rectum.  -Repeat CBC today secondary to ongoing rectal bleeding  -Colonoscopy benefits and risks discussed including risk with sedation, risk of bleeding, perforation and infection  -Further follow up to be determined after colonoscopy completed -Patient to contact office if rectal bleeding worsen -Apply a small amount of Desitin inside the anal opening and to the external anal area tid as needed for anal or hemorrhoidal irritation/bleeding.          CC:  No ref. provider found

## 2021-04-04 ENCOUNTER — Other Ambulatory Visit (INDEPENDENT_AMBULATORY_CARE_PROVIDER_SITE_OTHER): Payer: BC Managed Care – PPO

## 2021-04-04 ENCOUNTER — Ambulatory Visit (INDEPENDENT_AMBULATORY_CARE_PROVIDER_SITE_OTHER): Payer: BC Managed Care – PPO | Admitting: Nurse Practitioner

## 2021-04-04 ENCOUNTER — Encounter: Payer: Self-pay | Admitting: Nurse Practitioner

## 2021-04-04 VITALS — BP 112/70 | HR 75 | Ht 60.0 in | Wt 124.0 lb

## 2021-04-04 DIAGNOSIS — K6289 Other specified diseases of anus and rectum: Secondary | ICD-10-CM

## 2021-04-04 DIAGNOSIS — K625 Hemorrhage of anus and rectum: Secondary | ICD-10-CM

## 2021-04-04 DIAGNOSIS — K644 Residual hemorrhoidal skin tags: Secondary | ICD-10-CM

## 2021-04-04 LAB — CBC
HCT: 38.8 % (ref 36.0–46.0)
Hemoglobin: 13 g/dL (ref 12.0–15.0)
MCHC: 33.5 g/dL (ref 30.0–36.0)
MCV: 90.6 fl (ref 78.0–100.0)
Platelets: 423 10*3/uL — ABNORMAL HIGH (ref 150.0–400.0)
RBC: 4.28 Mil/uL (ref 3.87–5.11)
RDW: 13 % (ref 11.5–15.5)
WBC: 7.6 10*3/uL (ref 4.0–10.5)

## 2021-04-04 MED ORDER — PLENVU 140 G PO SOLR: ORAL | 0 refills | Status: DC

## 2021-04-04 NOTE — Progress Notes (Signed)
____________________________________________________________  Attending physician addendum:  Thank you for sending this case to me. I have reviewed the entire note and agree with the plan.  She is on my endoscopy schedule for next week to investigate this.  Wilfrid Lund, MD  ____________________________________________________________

## 2021-04-04 NOTE — Patient Instructions (Signed)
If you are age 49 or younger, your body mass index should be between 19-25. Your Body mass index is 24.22 kg/m. If this is out of the aformentioned range listed, please consider follow up with your Primary Care Provider.   PROCEDURES: You have been scheduled for a colonoscopy. Please follow the written instructions given to you at your visit today. Please pick up your prep supplies at the pharmacy within the next 1-3 days. If you use inhalers (even only as needed), please bring them with you on the day of your procedure.  LABS:  Lab work has been ordered for you today. Our lab is located in the basement. Press "B" on the elevator. The lab is located at the first door on the left as you exit the elevator.  RECOMMENDATIONS: Desitin: Apply a small amount to the external anal area three times a day as needed.  Please call our office if your symptoms worsen.  It was great seeing you today! Thank you for entrusting me with your care and choosing [facility].  Noralyn Pick, CRNP

## 2021-04-12 ENCOUNTER — Ambulatory Visit (AMBULATORY_SURGERY_CENTER): Payer: BC Managed Care – PPO | Admitting: Gastroenterology

## 2021-04-12 ENCOUNTER — Other Ambulatory Visit: Payer: Self-pay

## 2021-04-12 ENCOUNTER — Encounter: Payer: Self-pay | Admitting: Gastroenterology

## 2021-04-12 VITALS — BP 116/78 | HR 66 | Temp 98.2°F | Resp 13 | Ht 60.0 in | Wt 124.0 lb

## 2021-04-12 DIAGNOSIS — K625 Hemorrhage of anus and rectum: Secondary | ICD-10-CM | POA: Diagnosis not present

## 2021-04-12 DIAGNOSIS — C187 Malignant neoplasm of sigmoid colon: Secondary | ICD-10-CM

## 2021-04-12 DIAGNOSIS — D125 Benign neoplasm of sigmoid colon: Secondary | ICD-10-CM

## 2021-04-12 MED ORDER — SODIUM CHLORIDE 0.9 % IV SOLN
500.0000 mL | INTRAVENOUS | Status: DC
Start: 2021-04-12 — End: 2021-04-12

## 2021-04-12 NOTE — Progress Notes (Signed)
A and O x3. Report to RN. Tolerated MAC anesthesia well.

## 2021-04-12 NOTE — Progress Notes (Signed)
Pt's states no medical or surgical changes since previsit or office visit. 

## 2021-04-12 NOTE — Op Note (Signed)
[facility] Patient Name: Gabrielle Robertson Procedure Date: 04/12/2021 2:39 PM MRN: [id number] Endoscopist: Mallie Mussel L. Loletha Carrow , MD Age: 50 Referring MD:  Date of Birth: [birth year] Gender: Female Account #: [id number] Procedure:                Colonoscopy Indications:              Rectal bleeding Medicines:                Monitored Anesthesia Care Procedure:                Pre-Anesthesia Assessment:                           - Prior to the procedure, a History and Physical                            was performed, and patient medications and                            allergies were reviewed. The patient's tolerance of                            previous anesthesia was also reviewed. The risks                            and benefits of the procedure and the sedation                            options and risks were discussed with the patient.                            All questions were answered, and informed consent                            was obtained. Prior Anticoagulants: The patient has                            taken no previous anticoagulant or antiplatelet                            agents. ASA Grade Assessment: II - A patient with                            mild systemic disease. After reviewing the risks                            and benefits, the patient was deemed in                            satisfactory condition to undergo the procedure.                           After obtaining informed consent, the colonoscope  was passed under direct vision. Throughout the                            procedure, the patient's blood pressure, pulse, and                            oxygen saturations were monitored continuously. The                            Olympus CF-HQ190 [id number]) Colonoscope was                            introduced through the anus and advanced to the the                            terminal ileum, with identification of the                             appendiceal orifice and IC valve. The colonoscopy                            was performed without difficulty. The patient                            tolerated the procedure well. The quality of the                            bowel preparation was excellent. The terminal                            ileum, ileocecal valve, appendiceal orifice, and                            rectum were photographed. Scope In: 2:51:20 PM Scope Out: 3:27:04 PM Scope Withdrawal Time: 0 hours 32 minutes 24 seconds  Total Procedure Duration: 0 hours 35 minutes 44 seconds  Findings:                 The digital rectal exam findings include soft,                            mobile, anal verge lesion.                           A 15 x 20 mm polyp was found in the mid sigmoid                            colon. The polyp was pedunculated. Area (stalk) was                            successfully injected with 1.5 mL of a 1:100,000                            solution of epinephrine for drug delivery. The  polyp was removed with a hot snare. Resection and                            retrieval were complete. To prevent bleeding                            post-intervention, one hemostatic clip was                            successfully placed (MR conditional).                           A 15 mm polyp was found in the distal sigmoid                            colon. The polyp was sessile. The polyp was removed                            with a hot snare. Resection and retrieval were                            complete. Area was tattooed with an injection of 1                            mL of Spot (carbon black). One 0.5 ml spot                            injection just proximal to the mid sigmoid polyp                            and a 0.5 ml injection at the distal sigmoid                            polypectomy site. Both polyps were friable (large                            more  than the smaller), and visible sources of the                            recent bleeding.                           Anal papilla(e) were hypertrophied.                           The exam was otherwise without abnormality on                            direct and retroflexion views. Complications:            No immediate complications. Estimated Blood Loss:     Estimated blood loss: none. Estimated blood loss  was minimal. Impression:               - Soft, mobile, anal verge lesion found on digital                            rectal exam.                           - One 20 mm polyp in the mid sigmoid colon, removed                            with a hot snare. Resected and retrieved. Injected.                            Clip (MR conditional) was placed.                           - One 15 mm polyp in the distal sigmoid colon,                            removed with a hot snare. Resected and retrieved.                            Tattooed.                           - Anal papilla(e) were hypertrophied.                           - The examination was otherwise normal on direct                            and retroflexion views. Recommendation:           - Patient has a contact number available for                            emergencies. The signs and symptoms of potential                            delayed complications were discussed with the                            patient. Return to normal activities tomorrow.                            Written discharge instructions were provided to the                            patient.                           - Resume previous diet.                           - Continue present medications.                           -  Await pathology results.                           - Repeat colonoscopy is recommended for                            surveillance. The colonoscopy date will be                            determined after  pathology results from today's                            exam become available for review. Kashonda Sarkisyan L. Loletha Carrow, MD 04/12/2021 3:40:36 PM This report has been signed electronically.

## 2021-04-12 NOTE — Progress Notes (Signed)
Called to room to assist during endoscopic procedure.  Patient ID and intended procedure confirmed with present staff. Received instructions for my participation in the procedure from the performing physician.  

## 2021-04-12 NOTE — Patient Instructions (Signed)
Handout provided on polyps.   Clip card provided and explained. Keep this card with you for at least the next month. If you need and MRI be sure to show them the card so that they are aware of the metal clip placed and can evaluate for safety of MRI scan.   My office will contact you in about 7-10 business days to discuss the results of the polyps removed today.   YOU HAD AN ENDOSCOPIC PROCEDURE TODAY AT [facility]:   Refer to the procedure report that was given to you for any specific questions about what was found during the examination.  If the procedure report does not answer your questions, please call your gastroenterologist to clarify.  If you requested that your care partner not be given the details of your procedure findings, then the procedure report has been included in a sealed envelope for you to review at your convenience later.  YOU SHOULD EXPECT: Some feelings of bloating in the abdomen. Passage of more gas than usual.  Walking can help get rid of the air that was put into your GI tract during the procedure and reduce the bloating. If you had a lower endoscopy (such as a colonoscopy or flexible sigmoidoscopy) you may notice spotting of blood in your stool or on the toilet paper. If you underwent a bowel prep for your procedure, you may not have a normal bowel movement for a few days.  Please Note:  You might notice some irritation and congestion in your nose or some drainage.  This is from the oxygen used during your procedure.  There is no need for concern and it should clear up in a day or so.  SYMPTOMS TO REPORT IMMEDIATELY:   Following lower endoscopy (colonoscopy or flexible sigmoidoscopy):  Excessive amounts of blood in the stool  Significant tenderness or worsening of abdominal pains  Swelling of the abdomen that is new, acute  Fever of 100F or higher  For urgent or emergent issues, a gastroenterologist can be reached at any hour by calling [phone]  [phone]. Do not use MyChart messaging for urgent concerns.    DIET:  We do recommend a small meal at first, but then you may proceed to your regular diet.  Drink plenty of fluids but you should avoid alcoholic beverages for 24 hours.  ACTIVITY:  You should plan to take it easy for the rest of today and you should NOT DRIVE or use heavy machinery until tomorrow (because of the sedation medicines used during the test).    FOLLOW UP: Our staff will call the number listed on your records 48-72 hours following your procedure to check on you and address any questions or concerns that you may have regarding the information given to you following your procedure. If we do not reach you, we will leave a message.  We will attempt to reach you two times.  During this call, we will ask if you have developed any symptoms of COVID 19. If you develop any symptoms (ie: fever, flu-like symptoms, shortness of breath, cough etc.) before then, please call [phone].  If you test positive for Covid 19 in the 2 weeks post procedure, please call and report this information to [place].    If any biopsies were taken you will be contacted by phone or by letter within the next 1-3 weeks.  Please call us at [phone] if you have not heard about the biopsies in 3 weeks.    SIGNATURES/CONFIDENTIALITY:  You and/or your care partner have signed paperwork which will be entered into your electronic medical record.  These signatures attest to the fact that that the information above on your After Visit Summary has been reviewed and is understood.  Full responsibility of the confidentiality of this discharge information lies with you and/or your care-partner.

## 2021-04-14 ENCOUNTER — Telehealth: Payer: Self-pay | Admitting: *Deleted

## 2021-04-14 ENCOUNTER — Other Ambulatory Visit (INDEPENDENT_AMBULATORY_CARE_PROVIDER_SITE_OTHER): Payer: BC Managed Care – PPO

## 2021-04-14 DIAGNOSIS — D125 Benign neoplasm of sigmoid colon: Secondary | ICD-10-CM | POA: Diagnosis not present

## 2021-04-14 DIAGNOSIS — K625 Hemorrhage of anus and rectum: Secondary | ICD-10-CM | POA: Diagnosis not present

## 2021-04-14 DIAGNOSIS — K644 Residual hemorrhoidal skin tags: Secondary | ICD-10-CM

## 2021-04-14 DIAGNOSIS — K6289 Other specified diseases of anus and rectum: Secondary | ICD-10-CM

## 2021-04-14 LAB — HEMOGLOBIN AND HEMATOCRIT, BLOOD
HCT: 33.9 % — ABNORMAL LOW (ref 36.0–46.0)
Hemoglobin: 11.3 g/dL — ABNORMAL LOW (ref 12.0–15.0)

## 2021-04-14 NOTE — Telephone Encounter (Signed)
Called patient back and got her voicemail, her mailbox was full so I was not able to leave a message.

## 2021-04-14 NOTE — Telephone Encounter (Signed)
  Follow up Call-  Call back number 04/12/2021  Post procedure Call Back phone  # [phone]  Permission to leave phone message Yes  Some recent data might be hidden     Patient questions:  Do you have a fever, pain , or abdominal swelling? No. Pain Score  0 *  Have you tolerated food without any problems? Yes.    Have you been able to return to your normal activities? Yes.    Do you have any questions about your discharge instructions: Diet   No. Medications  No. Follow up visit  No.  Do you have questions or concerns about your Care? Yes.  - pt reports that she had four episodes of bloody expulsion per her rectum yesterday. Reports that it was "a lot of blood and it was darker in color (not bright red)." RN instructed pt that we did remove two large polyps from her sigmoid colon and that a clip was placed so it is likely she would see some blood. RN instructed pt that MD would be made aware and that our staff would call her back with any recommendations. RN instructed pt that if the blood per rectum seemed to worsen that she should seek care at an emergency department for evaluation. This information was communicated with pt's son helping to translate.   Actions: * If pain score is 4 or above: Physician/ provider Notified : Nelida Meuse, MD   1. Have you developed a fever since your procedure? no  2.   Have you had an respiratory symptoms (SOB or cough) since your procedure? no  3.   Have you tested positive for COVID 19 since your procedure no  4.   Have you had any family members/close contacts diagnosed with the COVID 19 since your procedure?  no   If yes to any of these questions please route to Joylene John, RN and Joella Prince, RN

## 2021-04-14 NOTE — Telephone Encounter (Signed)
Doc of the day Patient has large sigmoid pedunculated polyp removed on Tuesday. She will need to come to ER if she has persistent bleeding.  Sounds like she may have lost a lot of blood, likely post polypectomy bleed.  If she is no longer bleeding, please advise patient to come in to lab this afternoon. Please order Hgb and Hct, will need to make sure she is not severely anemic. Thanks

## 2021-04-14 NOTE — Telephone Encounter (Signed)
Spoke with patient, she states that she is no longer bleeding and only saw "a little bit" this morning. Advised patient to come to the office lab for lab work. She is aware that no appointment is necessary and she can stop by at her convenience this afternoon. Patient had no further concerns at the end of the call.   Lab order in epic.

## 2021-04-14 NOTE — Telephone Encounter (Signed)
Called pt back. She reports she only had a small amount of darker red blood in the toilet once this morning and nothing since then. RN instructed pt that the MD would like to check her blood count in our lab today if possible. Pt states she is able to come in today for lab draw at about 2pm. RN instructed pt that if she has any further bleeding that she would need to go to the ER for evaluation. Pt verbalized understanding.

## 2021-04-14 NOTE — Telephone Encounter (Signed)
Ok thanks 

## 2021-04-15 NOTE — Telephone Encounter (Signed)
I spoke with the patient this am.  She is aware.  She states she had just "a little bit of blood this morning".  She reports she is feeling well at this time.  She is aware to proceed to the ED for large amount of bleeding.  She understands we will contact her on Monday to get an update.

## 2021-04-15 NOTE — Telephone Encounter (Signed)
I am out of the office until 5/9 but checked messages this morning.  Thank you for taking this call.  [place], please call her this morning and tell her that the hemoglobin is down from 13 on 4/25 to 11.3 yesterday.  So she had a post-polypectomy bleed and is mildly anemic.  If she develops bleeding again to the degree it happened before, she must proceed to the emergency department.  Please also call her on 5/9 and send me a phone message then about how she is.  At that time we will recommend an iron supplement to take.  I do not want her to start it yet because it typically turns stool dark and it might be confused for bleeding.  Advise her to call us with any concerns, and please have DOD attend to calls today if needed.  - HD

## 2021-04-18 NOTE — Telephone Encounter (Addendum)
Thanks for the update.  Please advise her to take a 65 mg OTC "slow release" iron tablet once daily for 4 weeks to help bring her red blood cell count up.  Will let her know when pathology report available.  - HD

## 2021-04-18 NOTE — Telephone Encounter (Signed)
Called patient twice, vm is full. Unable to leave a voicemail at this time. Will attempt again.

## 2021-04-18 NOTE — Telephone Encounter (Signed)
Spoke with patient, she states that she is fine now and has not seen anymore bleeding. She states that she saw a little bit of blood on Friday and Saturday. No blood yesterday. She states that "everything is clear". Advised patient that I will give her a call back if Dr. Loletha Carrow has any further recommendations. Patient had no other concerns at the end of the call.

## 2021-04-19 NOTE — Telephone Encounter (Signed)
Called patient, her vm is full, unable to leave a voicemail at this time.

## 2021-04-21 ENCOUNTER — Telehealth: Payer: Self-pay | Admitting: Gastroenterology

## 2021-04-21 ENCOUNTER — Other Ambulatory Visit (INDEPENDENT_AMBULATORY_CARE_PROVIDER_SITE_OTHER): Payer: BC Managed Care – PPO

## 2021-04-21 DIAGNOSIS — C189 Malignant neoplasm of colon, unspecified: Secondary | ICD-10-CM

## 2021-04-21 LAB — CBC WITH DIFFERENTIAL/PLATELET
Basophils Absolute: 0 10*3/uL (ref 0.0–0.1)
Basophils Relative: 0.6 % (ref 0.0–3.0)
Eosinophils Absolute: 0.2 10*3/uL (ref 0.0–0.7)
Eosinophils Relative: 3 % (ref 0.0–5.0)
HCT: 32.3 % — ABNORMAL LOW (ref 36.0–46.0)
Hemoglobin: 11 g/dL — ABNORMAL LOW (ref 12.0–15.0)
Lymphocytes Relative: 42.9 % (ref 12.0–46.0)
Lymphs Abs: 2.8 10*3/uL (ref 0.7–4.0)
MCHC: 34.1 g/dL (ref 30.0–36.0)
MCV: 89 fl (ref 78.0–100.0)
Monocytes Absolute: 0.6 10*3/uL (ref 0.1–1.0)
Monocytes Relative: 8.3 % (ref 3.0–12.0)
Neutro Abs: 3 10*3/uL (ref 1.4–7.7)
Neutrophils Relative %: 45.2 % (ref 43.0–77.0)
Platelets: 378 10*3/uL (ref 150.0–400.0)
RBC: 3.63 Mil/uL — ABNORMAL LOW (ref 3.87–5.11)
RDW: 12.5 % (ref 11.5–15.5)
WBC: 6.6 10*3/uL (ref 4.0–10.5)

## 2021-04-21 LAB — COMPREHENSIVE METABOLIC PANEL
ALT: 15 U/L (ref 0–35)
AST: 19 U/L (ref 0–37)
Albumin: 4.3 g/dL (ref 3.5–5.2)
Alkaline Phosphatase: 69 U/L (ref 39–117)
BUN: 13 mg/dL (ref 6–23)
CO2: 29 mEq/L (ref 19–32)
Calcium: 9 mg/dL (ref 8.4–10.5)
Chloride: 105 mEq/L (ref 96–112)
Creatinine, Ser: 0.89 mg/dL (ref 0.40–1.20)
GFR: 76.5 mL/min (ref >60.00)
Glucose, Bld: 120 mg/dL — ABNORMAL HIGH (ref 70–99)
Potassium: 3.7 mEq/L (ref 3.5–5.1)
Sodium: 139 mEq/L (ref 135–145)
Total Bilirubin: 0.5 mg/dL (ref 0.2–1.2)
Total Protein: 7.4 g/dL (ref 6.0–8.3)

## 2021-04-21 NOTE — Telephone Encounter (Signed)
Referral, records, demographic and insurance information faxed to [place].   Lab orders and CT order in epic.   Patient has been scheduled for a CT scan of the abdomen/chest, and pelvis on Friday, 04/22/21 at 1 PM at [facility] CT. NPO solids 4 hours prior. Drink 1st bottle of contrast at 11 AM and 2nd bottle at 12 PM. I have discussed this information with patient and her husband. Patient will come in today for lab work and to pick up instructions and contrast. Patient and husband verbalized understanding of all information.   Contrast and instructions have been placed at receptionist desk.

## 2021-04-21 NOTE — Telephone Encounter (Signed)
Called patient and husband, no vm set up on either phone. Will try again later.

## 2021-04-21 NOTE — Telephone Encounter (Signed)
Lilia Pro from [place] called to let us know that she was able to speak with the patients husband and scheduled appointment for 04/27/21 at 2:10pm but she is not sure if it was in fact understood due to language barrier she asked if we can confirm with patient.

## 2021-04-21 NOTE — Telephone Encounter (Signed)
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  I explained that she needs further testing and a referral to surgery to plan a sigmoid resection. Reassured them that this cancer is most likely early stage.  Please help arrange the following:   - CT scan chest abdomen and pelvis with oral and IV contrast (I am aware there is a Producer, television/film/video of IV contrast, so when the radiology department asks [place] if this exam to wait a month or more, the answer is that it cannot wait because it is for cancer staging.)   - CBC, CMP, CEA   - Referral to [facility] surgery for colon cancer  This patient speaks English fairly well, but sometimes prefers to have things explained to her husband or son.  If you are either unable to reach the patient on the number listed for her, or if there is difficulty with communication, then please use the following numbers (and add them to her contact list in epic).  [name]'s number is [phone]  [name] number is [phone]  Let me know if there any questions.  - HD

## 2021-04-22 ENCOUNTER — Other Ambulatory Visit: Payer: Self-pay

## 2021-04-22 ENCOUNTER — Ambulatory Visit (INDEPENDENT_AMBULATORY_CARE_PROVIDER_SITE_OTHER)
Admission: RE | Admit: 2021-04-22 | Discharge: 2021-04-22 | Disposition: A | Discharge status: Home / Self Care | Payer: BC Managed Care – PPO | Source: Ambulatory Visit | Attending: Gastroenterology | Admitting: Gastroenterology

## 2021-04-22 DIAGNOSIS — C189 Malignant neoplasm of colon, unspecified: Secondary | ICD-10-CM

## 2021-04-22 LAB — CEA: CEA: 0.9 ng/mL

## 2021-04-22 MED ORDER — IOHEXOL 300 MG/ML  SOLN
100.0000 mL | Freq: Once | INTRAMUSCULAR | Status: AC | PRN
  Administered 2021-04-22: 100 mL via INTRAVENOUS

## 2021-04-22 NOTE — Telephone Encounter (Signed)
Spoke with patient and husband, they have been provided the appt information for [facility] and their address. Patient was able to pick up the contrast yesterday and will have CT scan today. Patient and husband verbalized understanding and had no concerns at the end of the call.

## 2021-04-22 NOTE — Telephone Encounter (Signed)
Spoke with patient and her husband in regards to recommendations. They both verbalized understanding and had no concerns at the end of the call.

## 2021-04-27 ENCOUNTER — Ambulatory Visit: Payer: Self-pay | Admitting: General Surgery

## 2021-04-27 NOTE — H&P (Signed)
The patient is a 50 year old female who presents with colorectal cancer. Interpreter declined. Pt wishes to use her son. 50 year old female who recently underwent a colonoscopy due to rectal bleeding. She was noted to have a hypertrophied anal papilla, which was felt to be the source of her bleeding. She was also noted to have 2 medium-sized sigmoid polyps. The more proximal polyp was on a stalk and resected completely. A surgical clip was placed across this site. The more distal sigmoid polyp was resected piecemeal and tattooed. Pathology showed invasive adenocarcinoma. Margins were noted to be negative on pathology report. The report also indicates multiple areas of invasive carcinoma across multiple pieces. Patient denies any difficulty with her bowel habits. She has never had any abdominal surgeries.   Past Surgical History ([place]; 04/27/2021 2:01 PM) Colon Polyp Removal - Colonoscopy  Diagnostic Studies History (Jocelyn Bond, CMA; 04/27/2021 2:01 PM) Mammogram >3 years ago  Allergies (Jocelyn Bond, CMA; 04/27/2021 2:00 PM) No Known Drug Allergies [04/27/2021]: Allergies Reconciled  Medication History (Jocelyn Bond, CMA; 04/27/2021 2:00 PM) Vitamin C (250MG  Tablet, Oral) Active. Iodine (Kelp) (Oral) Active. Medications Reconciled  Social History (Jocelyn Bond, CMA; 04/27/2021 2:01 PM) Caffeine use Tea. No alcohol use  Family History Karl Bales, CMA; 04/27/2021 2:01 PM) First Degree Relatives No pertinent family history  Pregnancy / Birth History Karl Bales, CMA; 04/27/2021 2:01 PM) Age at menarche 59 years. Age of menopause 31-50 Gravida 3 Irregular periods Maternal age 63-35 Para 3  Other Problems (Jocelyn Bond, CMA; 04/27/2021 2:01 PM) No pertinent past medical history     Review of Systems ([place]; 04/27/2021 2:01 PM) Skin Not Present- Change in Wart/Mole, Dryness, Hives, Jaundice, New Lesions, Non-Healing Wounds, Rash  and Ulcer. Gastrointestinal Not Present- Abdominal Pain, Bloating, Bloody Stool, Change in Bowel Habits, Chronic diarrhea, Constipation, Difficulty Swallowing, Excessive gas, Gets full quickly at meals, Hemorrhoids, Indigestion, Nausea, Rectal Pain and Vomiting. Female Genitourinary Not Present- Frequency, Nocturia, Painful Urination, Pelvic Pain and Urgency. Musculoskeletal Not Present- Back Pain, Joint Pain, Joint Stiffness, Muscle Pain, Muscle Weakness and Swelling of Extremities.  Vitals (Jocelyn Bond CMA; 04/27/2021 2:00 PM) 04/27/2021 2:00 PM Weight: 120.25 lb Height: 60in Body Surface Area: 1.5 m Body Mass Index: 23.48 kg/m  Temp.: 97.18F  Pulse: 84 (Regular)  P.OX: 96% (Room air) BP: 130/80(Sitting, Left Arm, Standard)        Physical Exam Leighton Ruff MD; 03/19/8118 2:21 PM)  General Mental Status-Alert. General Appearance-Cooperative. CV: RRR Lungs: CTA Abdomen Palpation/Percussion Palpation and Percussion of the abdomen reveal - Soft and Non Tender.    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 The invasive component was seen across multiple sections and extended into the neck of the polyp. The area of resection was tattooed. CEA and CT scans of the chest, abdomen and pelvis show no sign of metastatic disease. Given that the patient is low risk for complications of surgery and the possibility of residual cancer or carcinoma within the lymph nodes is present, I have recommended a robotic-assisted partial colectomy. This will most likely be a straightforward sigmoid resection. I discussed this with the patient and her son in detail. All questions were answered. The surgery and anatomy were described to the patient as well as the risks of surgery and the possible  complications. These include: Bleeding, deep  abdominal infections and possible wound complications such as hernia and infection, damage to adjacent structures, leak of surgical connections, which can lead to other surgeries and possibly an ostomy, possible need for other procedures, such as abscess drains in radiology, possible prolonged hospital stay, possible diarrhea from removal of part of the colon, possible constipation from narcotics, possible bowel, bladder or sexual dysfunction if having rectal surgery, prolonged fatigue/weakness or appetite loss, possible early recurrence of of disease, possible complications of their medical problems such as heart disease or arrhythmias or lung problems, death (less than 1%). I believe the patient understands and wishes to proceed with the surgery.

## 2021-05-20 ENCOUNTER — Other Ambulatory Visit (HOSPITAL_COMMUNITY): Payer: Self-pay

## 2021-05-20 NOTE — Patient Instructions (Addendum)
DUE TO COVID-19 ONLY ONE VISITOR IS ALLOWED TO COME WITH YOU AND STAY IN THE WAITING ROOM ONLY DURING PRE OP AND PROCEDURE DAY OF SURGERY. THE 1 VISITOR  MAY VISIT WITH YOU AFTER SURGERY IN YOUR PRIVATE ROOM DURING VISITING HOURS ONLY!  YOU NEED TO HAVE A COVID 19 TEST ON: 05/30/21 @ 9:00 AM, THIS TEST MUST BE DONE BEFORE SURGERY,  COVID TESTING SITE [place] [address], IT IS ON THE RIGHT GOING OUT [address] APPROXIMATELY  2 MINUTES PAST [employer] ON THE RIGHT. ONCE YOUR COVID TEST IS COMPLETED,  PLEASE BEGIN THE QUARANTINE INSTRUCTIONS AS OUTLINED IN YOUR HANDOUT.                Gabrielle Robertson   Your procedure is scheduled on: 06/02/21   Report to [facility] Main  Entrance   Report to short stay at : 5:15 AM     Call this number if you have problems the morning of surgery [phone]    Remember: DRINK 2 [place] AT  1000 PM AND 1 PRESURGERY DRINK THE DAY OF THE PROCEDURE 3 HOURS PRIOR TO SCHEDULED SURGERY. NO SOLIDS AFTER MIDNIGHT THE DAY PRIOR TO THE SURGERY. NOTHING BY MOUTH EXCEPT CLEAR LIQUIDS UNTIL THREE HOURS PRIOR TO SCHEDULED SURGERY. PLEASE FINISH PRESURGERY ENSURE DRINK PER SURGEON ORDER 3 HOURS PRIOR TO SCHEDULED SURGERY TIME WHICH NEEDS TO BE COMPLETED AT: 4:30 AM.   CLEAR LIQUID DIET  Foods Allowed                                                                     Foods Excluded  Coffee and tea, regular and decaf                             liquids that you cannot  Plain Jell-O any favor except red or purple                                           see through such as: Fruit ices (not with fruit pulp)                                     milk, soups, orange juice  Iced Popsicles                                    All solid food Carbonated beverages, regular and diet                                    Cranberry, grape and apple juices Sports drinks like Gatorade Lightly seasoned  clear broth or consume(fat free) Sugar, honey syrup  Sample Menu Breakfast  Lunch                                     Supper Cranberry juice                    Beef broth                            Chicken broth Jell-O                                     Grape juice                           Apple juice Coffee or tea                        Jell-O                                      Popsicle                                                Coffee or tea                        Coffee or tea  _____________________________________________________________________   BRUSH YOUR TEETH MORNING OF SURGERY AND RINSE YOUR MOUTH OUT, NO CHEWING GUM CANDY OR MINTS.                               You may not have any metal on your body including hair pins and              piercings  Do not wear jewelry, make-up, lotions, powders or perfumes, deodorant             Do not wear nail polish on your fingernails.  Do not shave  48 hours prior to surgery.    Do not bring valuables to the hospital. [place].  Contacts, dentures or bridgework may not be worn into surgery.  Leave suitcase in the car. After surgery it may be brought to your room.     Patients discharged the day of surgery will not be allowed to drive home. IF YOU ARE HAVING SURGERY AND GOING HOME THE SAME DAY, YOU MUST HAVE AN ADULT TO DRIVE YOU HOME AND BE WITH YOU FOR 24 HOURS. YOU MAY GO HOME BY TAXI OR UBER OR ORTHERWISE, BUT AN ADULT MUST ACCOMPANY YOU HOME AND STAY WITH YOU FOR 24 HOURS.  Name and phone number of your driver:  Special Instructions: N/A              Please read over the following fact sheets you were given: _____________________________________________________________________           [facility] - Preparing for Surgery Before surgery, you can play an  important role.  Because skin is not sterile, your skin needs to be as free of germs as  possible.  You can reduce the number of germs on your skin by washing with CHG (chlorahexidine gluconate) soap before surgery.  CHG is an antiseptic cleaner which kills germs and bonds with the skin to continue killing germs even after washing. Please DO NOT use if you have an allergy to CHG or antibacterial soaps.  If your skin becomes reddened/irritated stop using the CHG and inform your nurse when you arrive at Short Stay. Do not shave (including legs and underarms) for at least 48 hours prior to the first CHG shower.  You may shave your face/neck. Please follow these instructions carefully:  1.  Shower with CHG Soap the night before surgery and the  morning of Surgery.  2.  If you choose to wash your hair, wash your hair first as usual with your  normal  shampoo.  3.  After you shampoo, rinse your hair and body thoroughly to remove the  shampoo.                           4.  Use CHG as you would any other liquid soap.  You can apply chg directly  to the skin and wash                       Gently with a scrungie or clean washcloth.  5.  Apply the CHG Soap to your body ONLY FROM THE NECK DOWN.   Do not use on face/ open                           Wound or open sores. Avoid contact with eyes, ears mouth and genitals (private parts).                       Wash face,  Genitals (private parts) with your normal soap.             6.  Wash thoroughly, paying special attention to the area where your surgery  will be performed.  7.  Thoroughly rinse your body with warm water from the neck down.  8.  DO NOT shower/wash with your normal soap after using and rinsing off  the CHG Soap.                9.  Pat yourself dry with a clean towel.            10.  Wear clean pajamas.            11.  Place clean sheets on your bed the night of your first shower and do not  sleep with pets. Day of Surgery : Do not apply any lotions/deodorants the morning of surgery.  Please wear clean clothes to the hospital/surgery  center.  FAILURE TO FOLLOW THESE INSTRUCTIONS MAY RESULT IN THE CANCELLATION OF YOUR SURGERY PATIENT SIGNATURE_________________________________  NURSE SIGNATURE__________________________________  ________________________________________________________________________   Gabrielle Robertson  An incentive spirometer is a tool that can help keep your lungs clear and active. This tool measures how well you are filling your lungs with each breath. Taking long deep breaths may help reverse or decrease the chance of developing breathing (pulmonary) problems (especially infection) following: A long period of time when you are unable to move or be active. BEFORE THE PROCEDURE  If the spirometer includes an indicator to show your best effort, your nurse or respiratory therapist will set it to a desired goal. If possible, sit up straight or lean slightly forward. Try not to slouch. Hold the incentive spirometer in an upright position. INSTRUCTIONS FOR USE  Sit on the edge of your bed if possible, or sit up as far as you can in bed or on a chair. Hold the incentive spirometer in an upright position. Breathe out normally. Place the mouthpiece in your mouth and seal your lips tightly around it. Breathe in slowly and as deeply as possible, raising the piston or the ball toward the top of the column. Hold your breath for 3-5 seconds or for as long as possible. Allow the piston or ball to fall to the bottom of the column. Remove the mouthpiece from your mouth and breathe out normally. Rest for a few seconds and repeat Steps 1 through 7 at least 10 times every 1-2 hours when you are awake. Take your time and take a few normal breaths between deep breaths. The spirometer may include an indicator to show your best effort. Use the indicator as a goal to work toward during each repetition. After each set of 10 deep breaths, practice coughing to be sure your lungs are clear. If you have an incision (the cut  made at the time of surgery), support your incision when coughing by placing a pillow or rolled up towels firmly against it. Once you are able to get out of bed, walk around indoors and cough well. You may stop using the incentive spirometer when instructed by your caregiver.  RISKS AND COMPLICATIONS Take your time so you do not get dizzy or light-headed. If you are in pain, you may need to take or ask for pain medication before doing incentive spirometry. It is harder to take a deep breath if you are having pain. AFTER USE Rest and breathe slowly and easily. It can be helpful to keep track of a log of your progress. Your caregiver can provide you with a simple table to help with this. If you are using the spirometer at home, follow these instructions: [place] IF:  You are having difficultly using the spirometer. You have trouble using the spirometer as often as instructed. Your pain medication is not giving enough relief while using the spirometer. You develop fever of 100.5 F (38.1 C) or higher. SEEK IMMEDIATE MEDICAL CARE IF:  You cough up bloody sputum that had not been present before. You develop fever of 102 F (38.9 C) or greater. You develop worsening pain at or near the incision site. MAKE SURE YOU:  Understand these instructions. Will watch your condition. Will get help right away if you are not doing well or get worse. Document Released: 04/09/2007 Document Revised: 02/19/2012 Document Reviewed: 06/10/2007 [facility] Patient Information 2014 [place], [place].   ________________________________________________________________________

## 2021-05-25 ENCOUNTER — Encounter (HOSPITAL_COMMUNITY)
Admission: RE | Admit: 2021-05-25 | Discharge: 2021-05-25 | Disposition: A | Discharge status: Home / Self Care | Payer: BC Managed Care – PPO | Source: Ambulatory Visit | Attending: General Surgery | Admitting: General Surgery

## 2021-05-25 ENCOUNTER — Other Ambulatory Visit: Payer: Self-pay

## 2021-05-25 ENCOUNTER — Encounter (HOSPITAL_COMMUNITY): Payer: Self-pay

## 2021-05-25 DIAGNOSIS — Z01812 Encounter for preprocedural laboratory examination: Secondary | ICD-10-CM | POA: Insufficient documentation

## 2021-05-25 HISTORY — DX: Unspecified hemorrhoids: K64.9

## 2021-05-25 HISTORY — DX: Malignant (primary) neoplasm, unspecified: C80.1

## 2021-05-25 LAB — CBC
HCT: 40.2 % (ref 36.0–46.0)
Hemoglobin: 13.1 g/dL (ref 12.0–15.0)
MCH: 29.7 pg (ref 26.0–34.0)
MCHC: 32.6 g/dL (ref 30.0–36.0)
MCV: 91.2 fL (ref 80.0–100.0)
Platelets: 381 10*3/uL (ref 150–400)
RBC: 4.41 MIL/uL (ref 3.87–5.11)
RDW: 12.4 % (ref 11.5–15.5)
WBC: 6.1 10*3/uL (ref 4.0–10.5)
nRBC: 0 % (ref 0.0–0.2)

## 2021-05-25 NOTE — Progress Notes (Signed)
COVID Vaccine Completed: Yes Date COVID Vaccine completed: 09/18/20 COVID vaccine manufacturer: [place]      PCP - No PCP Cardiologist -   Chest x-ray -  EKG -  Stress Test -  ECHO -  Cardiac Cath -  Pacemaker/ICD device last checked:  Sleep Study -  CPAP -   Fasting Blood Sugar -  Checks Blood Sugar _____ times a day  Blood Thinner Instructions: Aspirin Instructions: Last Dose:  Anesthesia review:   Patient denies shortness of breath, fever, cough and chest pain at PAT appointment   Patient verbalized understanding of instructions that were given to them at the PAT appointment. Patient was also instructed that they will need to review over the PAT instructions again at home before surgery.

## 2021-05-30 ENCOUNTER — Other Ambulatory Visit (HOSPITAL_COMMUNITY)
Admission: RE | Admit: 2021-05-30 | Discharge: 2021-05-30 | Disposition: A | Discharge status: Home / Self Care | Payer: BC Managed Care – PPO | Source: Ambulatory Visit | Attending: General Surgery | Admitting: General Surgery

## 2021-05-30 DIAGNOSIS — Z20822 Contact with and (suspected) exposure to covid-19: Secondary | ICD-10-CM | POA: Insufficient documentation

## 2021-05-30 DIAGNOSIS — Z01812 Encounter for preprocedural laboratory examination: Secondary | ICD-10-CM | POA: Insufficient documentation

## 2021-05-30 LAB — SARS CORONAVIRUS 2 (TAT 6-24 HRS): SARS Coronavirus 2: NEGATIVE

## 2021-06-01 MED ORDER — BUPIVACAINE LIPOSOME 1.3 % IJ SUSP
20.0000 mL | Freq: Once | INTRAMUSCULAR | Status: DC
  Filled 2021-06-01: qty 20

## 2021-06-01 MED ORDER — SODIUM CHLORIDE 0.9 % IV SOLN
2.0000 g | INTRAVENOUS | Status: AC
  Administered 2021-06-02: 2 g via INTRAVENOUS
  Filled 2021-06-01: qty 2

## 2021-06-01 NOTE — Anesthesia Preprocedure Evaluation (Addendum)
Anesthesia Evaluation  Patient identified by MRN, date of birth, ID band Patient awake    Reviewed: Allergy & Precautions, NPO status , Patient's Chart, lab work & pertinent test results  Airway Mallampati: III  TM Distance: >3 FB Neck ROM: Full  Mouth opening: Limited Mouth Opening  Dental no notable dental hx. (+) Teeth Intact, Dental Advisory Given   Pulmonary neg pulmonary ROS,    Pulmonary exam normal breath sounds clear to auscultation       Cardiovascular negative cardio ROS Normal cardiovascular exam Rhythm:Regular Rate:Normal  HLD   Neuro/Psych negative neurological ROS  negative psych ROS   GI/Hepatic negative GI ROS, Neg liver ROS,   Endo/Other  Hypothyroidism   Renal/GU negative Renal ROS  negative genitourinary   Musculoskeletal negative musculoskeletal ROS (+)   Abdominal   Peds  Hematology negative hematology ROS (+)   Anesthesia Other Findings Colon CA  Reproductive/Obstetrics                            Anesthesia Physical Anesthesia Plan  ASA: 2  Anesthesia Plan: General   Post-op Pain Management:    Induction: Intravenous  PONV Risk Score and Plan: 3 and Midazolam, Dexamethasone and Ondansetron  Airway Management Planned: Oral ETT  Additional Equipment:   Intra-op Plan:   Post-operative Plan: Extubation in OR  Informed Consent: I have reviewed the patients History and Physical, chart, labs and discussed the procedure including the risks, benefits and alternatives for the proposed anesthesia with the patient or authorized representative who has indicated his/her understanding and acceptance.     Dental advisory given  Plan Discussed with: CRNA  Anesthesia Plan Comments:         Anesthesia Quick Evaluation

## 2021-06-02 ENCOUNTER — Inpatient Hospital Stay (HOSPITAL_COMMUNITY): Payer: BC Managed Care – PPO | Admitting: Anesthesiology

## 2021-06-02 ENCOUNTER — Encounter (HOSPITAL_COMMUNITY)
Admission: RE | Disposition: A | Discharge status: Home / Self Care | Payer: Self-pay | Source: Home / Self Care | Attending: General Surgery

## 2021-06-02 ENCOUNTER — Other Ambulatory Visit: Payer: Self-pay

## 2021-06-02 ENCOUNTER — Encounter (HOSPITAL_COMMUNITY): Payer: Self-pay | Admitting: General Surgery

## 2021-06-02 ENCOUNTER — Inpatient Hospital Stay (HOSPITAL_COMMUNITY)
Admission: RE | Admit: 2021-06-02 | Discharge: 2021-06-05 | DRG: 331 | Disposition: A | Discharge status: Home / Self Care | Payer: BC Managed Care – PPO | Attending: General Surgery | Admitting: General Surgery

## 2021-06-02 DIAGNOSIS — C187 Malignant neoplasm of sigmoid colon: Secondary | ICD-10-CM | POA: Diagnosis present

## 2021-06-02 DIAGNOSIS — E785 Hyperlipidemia, unspecified: Secondary | ICD-10-CM | POA: Diagnosis present

## 2021-06-02 DIAGNOSIS — Z20822 Contact with and (suspected) exposure to covid-19: Secondary | ICD-10-CM | POA: Diagnosis present

## 2021-06-02 DIAGNOSIS — E039 Hypothyroidism, unspecified: Secondary | ICD-10-CM | POA: Diagnosis present

## 2021-06-02 DIAGNOSIS — R531 Weakness: Secondary | ICD-10-CM | POA: Diagnosis not present

## 2021-06-02 DIAGNOSIS — R42 Dizziness and giddiness: Secondary | ICD-10-CM | POA: Diagnosis present

## 2021-06-02 DIAGNOSIS — Z85038 Personal history of other malignant neoplasm of large intestine: Secondary | ICD-10-CM | POA: Diagnosis not present

## 2021-06-02 DIAGNOSIS — Z79899 Other long term (current) drug therapy: Secondary | ICD-10-CM | POA: Diagnosis not present

## 2021-06-02 DIAGNOSIS — D75839 Thrombocytosis, unspecified: Secondary | ICD-10-CM | POA: Diagnosis not present

## 2021-06-02 DIAGNOSIS — R0600 Dyspnea, unspecified: Secondary | ICD-10-CM | POA: Diagnosis not present

## 2021-06-02 DIAGNOSIS — C189 Malignant neoplasm of colon, unspecified: Secondary | ICD-10-CM | POA: Diagnosis present

## 2021-06-02 LAB — PREGNANCY, URINE: Preg Test, Ur: NEGATIVE

## 2021-06-02 SURGERY — COLECTOMY, PARTIAL, ROBOT-ASSISTED, LAPAROSCOPIC
Anesthesia: General

## 2021-06-02 MED ORDER — MIDAZOLAM HCL 2 MG/2ML IJ SOLN
INTRAMUSCULAR | Status: AC
  Filled 2021-06-02: qty 2

## 2021-06-02 MED ORDER — HEPARIN SODIUM (PORCINE) 5000 UNIT/ML IJ SOLN
5000.0000 [IU] | Freq: Once | INTRAMUSCULAR | Status: AC
  Administered 2021-06-02: 5000 [IU] via SUBCUTANEOUS
  Filled 2021-06-02: qty 1

## 2021-06-02 MED ORDER — ROCURONIUM BROMIDE 10 MG/ML (PF) SYRINGE
PREFILLED_SYRINGE | INTRAVENOUS | Status: DC | PRN
  Administered 2021-06-02: 30 mg via INTRAVENOUS
  Administered 2021-06-02: 50 mg via INTRAVENOUS

## 2021-06-02 MED ORDER — LIDOCAINE 2% (20 MG/ML) 5 ML SYRINGE
INTRAMUSCULAR | Status: DC | PRN
  Administered 2021-06-02: 1.5 mg/kg/h via INTRAVENOUS
  Administered 2021-06-02: 40 mg via INTRAVENOUS

## 2021-06-02 MED ORDER — ALVIMOPAN 12 MG PO CAPS
12.0000 mg | ORAL_CAPSULE | Freq: Two times a day (BID) | ORAL | Status: DC
  Administered 2021-06-03 – 2021-06-04 (×4): 12 mg via ORAL
  Filled 2021-06-02 (×4): qty 1

## 2021-06-02 MED ORDER — SUGAMMADEX SODIUM 200 MG/2ML IV SOLN
INTRAVENOUS | Status: DC | PRN
  Administered 2021-06-02: 200 mg via INTRAVENOUS

## 2021-06-02 MED ORDER — GABAPENTIN 300 MG PO CAPS
300.0000 mg | ORAL_CAPSULE | ORAL | Status: AC
  Administered 2021-06-02: 300 mg via ORAL
  Filled 2021-06-02: qty 1

## 2021-06-02 MED ORDER — BUPIVACAINE LIPOSOME 1.3 % IJ SUSP
INTRAMUSCULAR | Status: DC | PRN
  Administered 2021-06-02: 20 mL

## 2021-06-02 MED ORDER — KETAMINE HCL 10 MG/ML IJ SOLN
INTRAMUSCULAR | Status: AC
  Filled 2021-06-02: qty 1

## 2021-06-02 MED ORDER — SIMETHICONE 80 MG PO CHEW: 40.0000 mg | CHEWABLE_TABLET | Freq: Four times a day (QID) | ORAL | Status: DC | PRN

## 2021-06-02 MED ORDER — SCOPOLAMINE 1 MG/3DAYS TD PT72
MEDICATED_PATCH | TRANSDERMAL | Status: AC
  Filled 2021-06-02: qty 1

## 2021-06-02 MED ORDER — KETAMINE HCL 10 MG/ML IJ SOLN
INTRAMUSCULAR | Status: DC | PRN
  Administered 2021-06-02: 15 mg via INTRAVENOUS

## 2021-06-02 MED ORDER — LACTATED RINGERS IV SOLN: INTRAVENOUS | Status: DC

## 2021-06-02 MED ORDER — DIPHENHYDRAMINE HCL 12.5 MG/5ML PO ELIX: 12.5000 mg | ORAL_SOLUTION | Freq: Four times a day (QID) | ORAL | Status: DC | PRN

## 2021-06-02 MED ORDER — PHENYLEPHRINE 40 MCG/ML (10ML) SYRINGE FOR IV PUSH (FOR BLOOD PRESSURE SUPPORT)
PREFILLED_SYRINGE | INTRAVENOUS | Status: AC
  Filled 2021-06-02: qty 10

## 2021-06-02 MED ORDER — PROPOFOL 10 MG/ML IV BOLUS
INTRAVENOUS | Status: DC | PRN
  Administered 2021-06-02: 70 mg via INTRAVENOUS

## 2021-06-02 MED ORDER — MIDAZOLAM HCL 5 MG/5ML IJ SOLN
INTRAMUSCULAR | Status: DC | PRN
  Administered 2021-06-02 (×2): 1 mg via INTRAVENOUS

## 2021-06-02 MED ORDER — SCOPOLAMINE 1 MG/3DAYS TD PT72
MEDICATED_PATCH | TRANSDERMAL | Status: DC | PRN
  Administered 2021-06-02: 1 via TRANSDERMAL

## 2021-06-02 MED ORDER — FENTANYL CITRATE (PF) 100 MCG/2ML IJ SOLN: 25.0000 ug | INTRAMUSCULAR | Status: DC | PRN

## 2021-06-02 MED ORDER — ALUM & MAG HYDROXIDE-SIMETH 200-200-20 MG/5ML PO SUSP: 30.0000 mL | Freq: Four times a day (QID) | ORAL | Status: DC | PRN

## 2021-06-02 MED ORDER — ONDANSETRON HCL 4 MG/2ML IJ SOLN
INTRAMUSCULAR | Status: DC | PRN
  Administered 2021-06-02: 4 mg via INTRAVENOUS

## 2021-06-02 MED ORDER — FENTANYL CITRATE (PF) 100 MCG/2ML IJ SOLN
INTRAMUSCULAR | Status: DC | PRN
  Administered 2021-06-02: 100 ug via INTRAVENOUS

## 2021-06-02 MED ORDER — SACCHAROMYCES BOULARDII 250 MG PO CAPS
250.0000 mg | ORAL_CAPSULE | Freq: Two times a day (BID) | ORAL | Status: DC
  Administered 2021-06-02 – 2021-06-05 (×6): 250 mg via ORAL
  Filled 2021-06-02 (×6): qty 1

## 2021-06-02 MED ORDER — LIDOCAINE 2% (20 MG/ML) 5 ML SYRINGE
INTRAMUSCULAR | Status: AC
  Filled 2021-06-02: qty 5

## 2021-06-02 MED ORDER — DIPHENHYDRAMINE HCL 50 MG/ML IJ SOLN
INTRAMUSCULAR | Status: DC | PRN
  Administered 2021-06-02: 12.5 mg via INTRAVENOUS

## 2021-06-02 MED ORDER — RINGERS IRRIGATION IR SOLN
Status: DC | PRN
  Administered 2021-06-02: 1

## 2021-06-02 MED ORDER — ACETAMINOPHEN 500 MG PO TABS
1000.0000 mg | ORAL_TABLET | ORAL | Status: AC
  Administered 2021-06-02: 1000 mg via ORAL
  Filled 2021-06-02: qty 2

## 2021-06-02 MED ORDER — ONDANSETRON HCL 4 MG/2ML IJ SOLN
INTRAMUSCULAR | Status: AC
  Filled 2021-06-02: qty 2

## 2021-06-02 MED ORDER — CHLORHEXIDINE GLUCONATE CLOTH 2 % EX PADS
6.0000 | MEDICATED_PAD | Freq: Every day | CUTANEOUS | Status: DC
  Administered 2021-06-02: 6 via TOPICAL

## 2021-06-02 MED ORDER — ACETAMINOPHEN 500 MG PO TABS
ORAL_TABLET | ORAL | Status: AC
  Filled 2021-06-02: qty 2

## 2021-06-02 MED ORDER — ONDANSETRON HCL 4 MG/2ML IJ SOLN
4.0000 mg | Freq: Four times a day (QID) | INTRAMUSCULAR | Status: DC | PRN
  Administered 2021-06-02: 4 mg via INTRAVENOUS
  Filled 2021-06-02: qty 2

## 2021-06-02 MED ORDER — ENSURE SURGERY PO LIQD
237.0000 mL | Freq: Two times a day (BID) | ORAL | Status: DC
  Administered 2021-06-04: 237 mL via ORAL

## 2021-06-02 MED ORDER — BUPIVACAINE-EPINEPHRINE 0.25% -1:200000 IJ SOLN
INTRAMUSCULAR | Status: DC | PRN
  Administered 2021-06-02: 30 mL

## 2021-06-02 MED ORDER — HYDROMORPHONE HCL 1 MG/ML IJ SOLN: 0.5000 mg | INTRAMUSCULAR | Status: DC | PRN

## 2021-06-02 MED ORDER — ORAL CARE MOUTH RINSE: 15.0000 mL | Freq: Once | OROMUCOSAL | Status: AC

## 2021-06-02 MED ORDER — DIPHENHYDRAMINE HCL 50 MG/ML IJ SOLN: 12.5000 mg | Freq: Four times a day (QID) | INTRAMUSCULAR | Status: DC | PRN

## 2021-06-02 MED ORDER — PROPOFOL 10 MG/ML IV BOLUS
INTRAVENOUS | Status: AC
  Filled 2021-06-02: qty 20

## 2021-06-02 MED ORDER — ACETAMINOPHEN 500 MG PO TABS
1000.0000 mg | ORAL_TABLET | Freq: Four times a day (QID) | ORAL | Status: DC
  Administered 2021-06-02 – 2021-06-05 (×11): 1000 mg via ORAL
  Filled 2021-06-02 (×10): qty 2

## 2021-06-02 MED ORDER — ACETAMINOPHEN 500 MG PO TABS: 1000.0000 mg | ORAL_TABLET | Freq: Once | ORAL | Status: DC

## 2021-06-02 MED ORDER — ONDANSETRON HCL 4 MG PO TABS: 4.0000 mg | ORAL_TABLET | Freq: Four times a day (QID) | ORAL | Status: DC | PRN

## 2021-06-02 MED ORDER — ALVIMOPAN 12 MG PO CAPS
12.0000 mg | ORAL_CAPSULE | ORAL | Status: AC
  Administered 2021-06-02: 12 mg via ORAL
  Filled 2021-06-02: qty 1

## 2021-06-02 MED ORDER — FENTANYL CITRATE (PF) 250 MCG/5ML IJ SOLN
INTRAMUSCULAR | Status: AC
  Filled 2021-06-02: qty 5

## 2021-06-02 MED ORDER — KCL IN DEXTROSE-NACL 20-5-0.45 MEQ/L-%-% IV SOLN
INTRAVENOUS | Status: DC
  Filled 2021-06-02 (×2): qty 1000

## 2021-06-02 MED ORDER — DIPHENHYDRAMINE HCL 50 MG/ML IJ SOLN
INTRAMUSCULAR | Status: AC
  Filled 2021-06-02: qty 1

## 2021-06-02 MED ORDER — ENOXAPARIN SODIUM 40 MG/0.4ML IJ SOSY
40.0000 mg | PREFILLED_SYRINGE | INTRAMUSCULAR | Status: DC
  Administered 2021-06-03 – 2021-06-05 (×3): 40 mg via SUBCUTANEOUS
  Filled 2021-06-02 (×3): qty 0.4

## 2021-06-02 MED ORDER — BUPIVACAINE-EPINEPHRINE (PF) 0.25% -1:200000 IJ SOLN
INTRAMUSCULAR | Status: AC
  Filled 2021-06-02: qty 30

## 2021-06-02 MED ORDER — CHLORHEXIDINE GLUCONATE 0.12 % MT SOLN
15.0000 mL | Freq: Once | OROMUCOSAL | Status: AC
  Administered 2021-06-02: 15 mL via OROMUCOSAL

## 2021-06-02 MED ORDER — DEXAMETHASONE SODIUM PHOSPHATE 10 MG/ML IJ SOLN
INTRAMUSCULAR | Status: AC
  Filled 2021-06-02: qty 1

## 2021-06-02 MED ORDER — GABAPENTIN 100 MG PO CAPS
100.0000 mg | ORAL_CAPSULE | Freq: Two times a day (BID) | ORAL | Status: DC
  Administered 2021-06-02 – 2021-06-05 (×6): 100 mg via ORAL
  Filled 2021-06-02 (×6): qty 1

## 2021-06-02 MED ORDER — EPHEDRINE SULFATE 50 MG/ML IJ SOLN
INTRAMUSCULAR | Status: DC | PRN
  Administered 2021-06-02 (×2): 10 mg via INTRAVENOUS

## 2021-06-02 MED ORDER — ROCURONIUM BROMIDE 10 MG/ML (PF) SYRINGE
PREFILLED_SYRINGE | INTRAVENOUS | Status: AC
  Filled 2021-06-02: qty 10

## 2021-06-02 MED ORDER — ENSURE PRE-SURGERY PO LIQD
296.0000 mL | Freq: Once | ORAL | Status: DC
  Filled 2021-06-02: qty 296

## 2021-06-02 MED ORDER — ENSURE PRE-SURGERY PO LIQD
592.0000 mL | Freq: Once | ORAL | Status: DC
  Filled 2021-06-02: qty 592

## 2021-06-02 MED ORDER — DEXAMETHASONE SODIUM PHOSPHATE 4 MG/ML IJ SOLN
INTRAMUSCULAR | Status: DC | PRN
  Administered 2021-06-02: 8 mg via INTRAVENOUS

## 2021-06-02 MED ORDER — PHENYLEPHRINE 40 MCG/ML (10ML) SYRINGE FOR IV PUSH (FOR BLOOD PRESSURE SUPPORT)
PREFILLED_SYRINGE | INTRAVENOUS | Status: DC | PRN
  Administered 2021-06-02: 80 ug via INTRAVENOUS
  Administered 2021-06-02: 120 ug via INTRAVENOUS
  Administered 2021-06-02 (×2): 80 ug via INTRAVENOUS

## 2021-06-02 MED ORDER — EPHEDRINE 5 MG/ML INJ
INTRAVENOUS | Status: AC
  Filled 2021-06-02: qty 10

## 2021-06-02 MED ORDER — LIDOCAINE HCL 2 % IJ SOLN
INTRAMUSCULAR | Status: AC
  Filled 2021-06-02: qty 20

## 2021-06-02 SURGICAL SUPPLY — 94 items
ADH SKN CLS APL DERMABOND .7 (GAUZE/BANDAGES/DRESSINGS) ×1
BLADE EXTENDED COATED 6.5IN (ELECTRODE) IMPLANT
CANNULA REDUC XI 12-8 STAPL (CANNULA)
CANNULA REDUC XI 12-8MM STAPL (CANNULA)
CANNULA REDUCER 12-8 DVNC XI (CANNULA) IMPLANT
CELLS DAT CNTRL 66122 CELL SVR (MISCELLANEOUS) IMPLANT
COVER SURGICAL LIGHT HANDLE (MISCELLANEOUS) ×6 IMPLANT
COVER TIP SHEARS 8 DVNC (MISCELLANEOUS) ×1 IMPLANT
COVER TIP SHEARS 8MM DA VINCI (MISCELLANEOUS) ×3
COVER WAND RF STERILE (DRAPES) ×3 IMPLANT
DECANTER SPIKE VIAL GLASS SM (MISCELLANEOUS) IMPLANT
DERMABOND ADVANCED (GAUZE/BANDAGES/DRESSINGS) ×2
DERMABOND ADVANCED .7 DNX12 (GAUZE/BANDAGES/DRESSINGS) IMPLANT
DRAIN CHANNEL 19F RND (DRAIN) IMPLANT
DRAPE ARM DVNC X/XI (DISPOSABLE) ×4 IMPLANT
DRAPE COLUMN DVNC XI (DISPOSABLE) ×1 IMPLANT
DRAPE DA VINCI XI ARM (DISPOSABLE) ×12
DRAPE DA VINCI XI COLUMN (DISPOSABLE) ×3
DRAPE SURG IRRIG POUCH 19X23 (DRAPES) ×3 IMPLANT
DRSG OPSITE POSTOP 4X10 (GAUZE/BANDAGES/DRESSINGS) IMPLANT
DRSG OPSITE POSTOP 4X6 (GAUZE/BANDAGES/DRESSINGS) ×2 IMPLANT
DRSG OPSITE POSTOP 4X8 (GAUZE/BANDAGES/DRESSINGS) IMPLANT
ELECT PENCIL ROCKER SW 15FT (MISCELLANEOUS) ×3 IMPLANT
ELECT REM PT RETURN 15FT ADLT (MISCELLANEOUS) ×3 IMPLANT
ENDOLOOP SUT PDS II  0 18 (SUTURE)
ENDOLOOP SUT PDS II 0 18 (SUTURE) IMPLANT
EVACUATOR SILICONE 100CC (DRAIN) IMPLANT
GLOVE SURG ENC MOIS LTX SZ6.5 (GLOVE) ×9 IMPLANT
GLOVE SURG UNDER POLY LF SZ7 (GLOVE) ×6 IMPLANT
GOWN STRL REUS W/TWL XL LVL3 (GOWN DISPOSABLE) ×9 IMPLANT
GRASPER SUT TROCAR 14GX15 (MISCELLANEOUS) IMPLANT
HOLDER FOLEY CATH W/STRAP (MISCELLANEOUS) ×3 IMPLANT
IRRIG SUCT STRYKERFLOW 2 WTIP (MISCELLANEOUS) ×3
IRRIGATION SUCT STRKRFLW 2 WTP (MISCELLANEOUS) ×1 IMPLANT
KIT PROCEDURE DA VINCI SI (MISCELLANEOUS)
KIT PROCEDURE DVNC SI (MISCELLANEOUS) IMPLANT
KIT TURNOVER KIT A (KITS) ×3 IMPLANT
NDL INSUFFLATION 14GA 120MM (NEEDLE) ×1 IMPLANT
NEEDLE INSUFFLATION 14GA 120MM (NEEDLE) ×3 IMPLANT
PACK CARDIOVASCULAR III (CUSTOM PROCEDURE TRAY) ×3 IMPLANT
PACK COLON (CUSTOM PROCEDURE TRAY) ×3 IMPLANT
PAD POSITIONING PINK XL (MISCELLANEOUS) ×3 IMPLANT
PORT LAP GEL ALEXIS MED 5-9CM (MISCELLANEOUS) IMPLANT
RELOAD STAPLE 60 3.5 BLU DVNC (STAPLE) IMPLANT
RELOAD STAPLE 60 4.3 GRN DVNC (STAPLE) IMPLANT
RELOAD STAPLER 3.5X60 BLU DVNC (STAPLE) ×1 IMPLANT
RELOAD STAPLER 4.3X60 GRN DVNC (STAPLE) IMPLANT
RETRACTOR WND ALEXIS 18 MED (MISCELLANEOUS) IMPLANT
RTRCTR WOUND ALEXIS 18CM MED (MISCELLANEOUS)
SCISSORS LAP 5X35 DISP (ENDOMECHANICALS) IMPLANT
SEAL CANN UNIV 5-8 DVNC XI (MISCELLANEOUS) ×3 IMPLANT
SEAL XI 5MM-8MM UNIVERSAL (MISCELLANEOUS) ×9
SEALER VESSEL DA VINCI XI (MISCELLANEOUS) ×3
SEALER VESSEL EXT DVNC XI (MISCELLANEOUS) ×1 IMPLANT
SOLUTION ELECTROLUBE (MISCELLANEOUS) ×3 IMPLANT
STAPLER 60 DA VINCI SURE FORM (STAPLE) ×3
STAPLER 60 SUREFORM DVNC (STAPLE) IMPLANT
STAPLER CANNULA SEAL DVNC XI (STAPLE) IMPLANT
STAPLER CANNULA SEAL XI (STAPLE)
STAPLER ECHELON POWER CIR 29 (STAPLE) ×2 IMPLANT
STAPLER ECHELON POWER CIR 31 (STAPLE) IMPLANT
STAPLER RELOAD 3.5X60 BLU DVNC (STAPLE) ×1
STAPLER RELOAD 3.5X60 BLUE (STAPLE) ×3
STAPLER RELOAD 4.3X60 GREEN (STAPLE)
STAPLER RELOAD 4.3X60 GRN DVNC (STAPLE)
STOPCOCK 4 WAY LG BORE MALE ST (IV SETS) ×6 IMPLANT
SUT ETHILON 2 0 PS N (SUTURE) IMPLANT
SUT NOVA NAB DX-16 0-1 5-0 T12 (SUTURE) ×6 IMPLANT
SUT PROLENE 2 0 KS (SUTURE) IMPLANT
SUT SILK 2 0 (SUTURE) ×3
SUT SILK 2 0 SH CR/8 (SUTURE) IMPLANT
SUT SILK 2-0 18XBRD TIE 12 (SUTURE) ×1 IMPLANT
SUT SILK 3 0 (SUTURE)
SUT SILK 3 0 SH CR/8 (SUTURE) ×3 IMPLANT
SUT SILK 3-0 18XBRD TIE 12 (SUTURE) IMPLANT
SUT V-LOC BARB 180 2/0GR6 GS22 (SUTURE)
SUT VIC AB 2-0 SH 18 (SUTURE) IMPLANT
SUT VIC AB 2-0 SH 27 (SUTURE) ×3
SUT VIC AB 2-0 SH 27X BRD (SUTURE) IMPLANT
SUT VIC AB 3-0 SH 18 (SUTURE) IMPLANT
SUT VIC AB 4-0 PS2 27 (SUTURE) ×6 IMPLANT
SUT VICRYL 0 UR6 27IN ABS (SUTURE) ×3 IMPLANT
SUT VICRYL 4-0 PS2 18IN ABS (SUTURE) ×2 IMPLANT
SUTURE V-LC BRB 180 2/0GR6GS22 (SUTURE) IMPLANT
SYR 10ML ECCENTRIC (SYRINGE) ×3 IMPLANT
SYS LAPSCP GELPORT 120MM (MISCELLANEOUS)
SYSTEM LAPSCP GELPORT 120MM (MISCELLANEOUS) IMPLANT
TOWEL OR 17X26 10 PK STRL BLUE (TOWEL DISPOSABLE) IMPLANT
TOWEL OR NON WOVEN STRL DISP B (DISPOSABLE) ×3 IMPLANT
TRAY FOLEY MTR SLVR 16FR STAT (SET/KITS/TRAYS/PACK) ×3 IMPLANT
TROCAR ADV FIXATION 5X100MM (TROCAR) ×3 IMPLANT
TUBING CONNECTING 10 (TUBING) ×4 IMPLANT
TUBING CONNECTING 10' (TUBING) ×2
TUBING INSUFFLATION 10FT LAP (TUBING) ×3 IMPLANT

## 2021-06-02 NOTE — Discharge Instructions (Signed)
SURGERY: POST OP INSTRUCTIONS (Surgery for small bowel obstruction, colon resection, etc)   ######################################################################  EAT Gradually transition to a high fiber diet with a fiber supplement over the next few days after discharge  WALK Walk an hour a day.  Control your pain to do that.    CONTROL PAIN Control pain so that you can walk, sleep, tolerate sneezing/coughing, go up/down stairs.  HAVE A BOWEL MOVEMENT DAILY Keep your bowels regular to avoid problems.  OK to try a laxative to override constipation.  OK to use an antidairrheal to slow down diarrhea.  Call if not better after 2 tries  CALL IF YOU HAVE PROBLEMS/CONCERNS Call if you are still struggling despite following these instructions. Call if you have concerns not answered by these instructions  ######################################################################   DIET Follow a light diet the first few days at home.  Start with a bland diet such as soups, liquids, starchy foods, low fat foods, etc.  If you feel full, bloated, or constipated, stay on a ful liquid or pureed/blenderized diet for a few days until you feel better and no longer constipated. Be sure to drink plenty of fluids every day to avoid getting dehydrated (feeling dizzy, not urinating, etc.). Gradually add a fiber supplement to your diet over the next week.  Gradually get back to a regular solid diet.  Avoid fast food or heavy meals the first week as you are more likely to get nauseated. It is expected for your digestive tract to need a few months to get back to normal.  It is common for your bowel movements and stools to be irregular.  You will have occasional bloating and cramping that should eventually fade away.  Until you are eating solid food normally, off all pain medications, and back to regular activities; your bowels will not be normal. Focus on eating a low-fat, high fiber diet the rest of your life  (See Getting to Good Bowel Health, below).  CARE of your INCISION or WOUND  It is good for closed incisions and even open wounds to be washed every day.  Shower every day.  Short baths are fine.  Wash the incisions and wounds clean with soap & water.    You may leave closed incisions open to air if it is dry.   You may cover the incision with clean gauze & replace it after your daily shower for comfort.  STAPLES: You have skin staples.  Leave them in place & set up an appointment for them to be removed by a surgery office nurse ~10 days after surgery. = 1st week of January 2024    ACTIVITIES as tolerated Start light daily activities --- self-care, walking, climbing stairs-- beginning the day after surgery.  Gradually increase activities as tolerated.  Control your pain to be active.  Stop when you are tired.  Ideally, walk several times a day, eventually an hour a day.   Most people are back to most day-to-day activities in a few weeks.  It takes 4-8 weeks to get back to unrestricted, intense activity. If you can walk 30 minutes without difficulty, it is safe to try more intense activity such as jogging, treadmill, bicycling, low-impact aerobics, swimming, etc. Save the most intensive and strenuous activity for last (Usually 4-8 weeks after surgery) such as sit-ups, heavy lifting, contact sports, etc.  Refrain from any intense heavy lifting or straining until you are off narcotics for pain control.  You will have off days, but things should improve   week-by-week. DO NOT PUSH THROUGH PAIN.  Let pain be your guide: If it hurts to do something, don't do it.  Pain is your body warning you to avoid that activity for another week until the pain goes down. You may drive when you are no longer taking narcotic prescription pain medication, you can comfortably wear a seatbelt, and you can safely make sudden turns/stops to protect yourself without hesitating due to pain. You may have sexual intercourse when it  is comfortable. If it hurts to do something, stop.  MEDICATIONS Take your usually prescribed home medications unless otherwise directed.   Blood thinners:  Usually you can restart any strong blood thinners after the second postoperative day.  It is OK to take aspirin right away.     If you are on strong blood thinners (warfarin/Coumadin, Plavix, Xerelto, Eliquis, Pradaxa, etc), discuss with your surgeon, medicine PCP, and/or cardiologist for instructions on when to restart the blood thinner & if blood monitoring is needed (PT/INR blood check, etc).     PAIN CONTROL Pain after surgery or related to activity is often due to strain/injury to muscle, tendon, nerves and/or incisions.  This pain is usually short-term and will improve in a few months.  To help speed the process of healing and to get back to regular activity more quickly, DO THE FOLLOWING THINGS TOGETHER: Increase activity gradually.  DO NOT PUSH THROUGH PAIN Use Ice and/or Heat Try Gentle Massage and/or Stretching Take over the counter pain medication Take Narcotic prescription pain medication for more severe pain  Good pain control = faster recovery.  It is better to take more medicine to be more active than to stay in bed all day to avoid medications.  Increase activity gradually Avoid heavy lifting at first, then increase to lifting as tolerated over the next 6 weeks. Do not "push through" the pain.  Listen to your body and avoid positions and maneuvers than reproduce the pain.  Wait a few days before trying something more intense Walking an hour a day is encouraged to help your body recover faster and more safely.  Start slowly and stop when getting sore.  If you can walk 30 minutes without stopping or pain, you can try more intense activity (running, jogging, aerobics, cycling, swimming, treadmill, sex, sports, weightlifting, etc.) Remember: If it hurts to do it, then don't do it! Use Ice and/or Heat You will have swelling and  bruising around the incisions.  This will take several weeks to resolve. Ice packs or heating pads (6-8 times a day, 30-60 minutes at a time) will help sooth soreness & bruising. Some people prefer to use ice alone, heat alone, or alternate between ice & heat.  Experiment and see what works best for you.  Consider trying ice for the first few days to help decrease swelling and bruising; then, switch to heat to help relax sore spots and speed recovery. Shower every day.  Short baths are fine.  It feels good!  Keep the incisions and wounds clean with soap & water.   Try Gentle Massage and/or Stretching Massage at the area of pain many times a day Stop if you feel pain - do not overdo it Take over the counter pain medication This helps the muscle and nerve tissues become less irritable and calm down faster Choose ONE of the following over-the-counter anti-inflammatory medications: Acetaminophen 500mg tabs (Tylenol) 1-2 pills with every meal and just before bedtime (avoid if you have liver problems or if you have   acetaminophen in you narcotic prescription) Naproxen 220mg tabs (ex. Aleve, Naprosyn) 1-2 pills twice a day (avoid if you have kidney, stomach, IBD, or bleeding problems) Ibuprofen 200mg tabs (ex. Advil, Motrin) 3-4 pills with every meal and just before bedtime (avoid if you have kidney, stomach, IBD, or bleeding problems) Take with food/snack several times a day as directed for at least 2 weeks to help keep pain / soreness down & more manageable. Take Narcotic prescription pain medication for more severe pain A prescription for strong pain control is often given to you upon discharge (for example: oxycodone/Percocet, hydrocodone/Norco/Vicodin, or tramadol/Ultram) Take your pain medication as prescribed. Be mindful that most narcotic prescriptions contain Tylenol (acetaminophen) as well - avoid taking too much Tylenol. If you are having problems/concerns with the prescription medicine (does  not control pain, nausea, vomiting, rash, itching, etc.), please call us [phone] to see if we need to switch you to a different pain medicine that will work better for you and/or control your side effects better. If you need a refill on your pain medication, you must call the office before 4 pm and on weekdays only.  By federal law, prescriptions for narcotics cannot be called into a pharmacy.  They must be filled out on paper & picked up from our office by the patient or authorized caretaker.  Prescriptions cannot be filled after 4 pm nor on weekends.    WHEN TO CALL US [phone] Severe uncontrolled or worsening pain  Fever over 101 F (38.5 C) Concerns with the incision: Worsening pain, redness, rash/hives, swelling, bleeding, or drainage Reactions / problems with new medications (itching, rash, hives, nausea, etc.) Nausea and/or vomiting Difficulty urinating Difficulty breathing Worsening fatigue, dizziness, lightheadedness, blurred vision Other concerns If you are not getting better after two weeks or are noticing you are getting worse, contact our office [phone] for further advice.  We may need to adjust your medications, re-evaluate you in the office, send you to the emergency room, or see what other things we can do to help. The clinic staff is available to answer your questions during regular business hours (8:30am-5pm).  Please don't hesitate to call and ask to speak to one of our nurses for clinical concerns.    A surgeon from [facility] is always on call at the hospitals 24 hours/day If you have a medical emergency, go to the nearest emergency room or call 911.  FOLLOW UP in our office One the day of your discharge from the hospital (or the next business weekday), please call [facility] to set up or confirm an appointment to see your surgeon in the office for a follow-up appointment.  Usually it is 2-3 weeks after your surgery.   If you  have skin staples at your incision(s), let the office know so we can set up a time in the office for the nurse to remove them (usually around 10 days after surgery). Make sure that you call for appointments the day of discharge (or the next business weekday) from the hospital to ensure a convenient appointment time. IF YOU HAVE DISABILITY OR FAMILY LEAVE FORMS, BRING THEM TO THE OFFICE FOR PROCESSING.  DO NOT GIVE THEM TO YOUR DOCTOR.  [facility] [address], [address], [place], [place]  [address] ? [phone] - Main [phone] - Toll Free,  [phone] - Fax [email or web address]    GETTING TO GOOD BOWEL HEALTH. It is expected for your digestive tract to   need a few months to get back to normal.  It is common for your bowel movements and stools to be irregular.  You will have occasional bloating and cramping that should eventually fade away.  Until you are eating solid food normally, off all pain medications, and back to regular activities; your bowels will not be normal.   Avoiding constipation The goal: ONE SOFT BOWEL MOVEMENT A DAY!    Drink plenty of fluids.  Choose water first. TAKE A FIBER SUPPLEMENT EVERY DAY THE REST OF YOUR LIFE During your first week back home, gradually add back a fiber supplement every day Experiment which form you can tolerate.   There are many forms such as powders, tablets, wafers, gummies, etc Psyllium bran (Metamucil), methylcellulose (Citrucel), Miralax or Glycolax, Benefiber, Flax Seed.  Adjust the dose week-by-week (1/2 dose/day to 6 doses a day) until you are moving your bowels 1-2 times a day.  Cut back the dose or try a different fiber product if it is giving you problems such as diarrhea or bloating. Sometimes a laxative is needed to help jump-start bowels if constipated until the fiber supplement can help regulate your bowels.  If you are tolerating eating & you are farting, it is okay to try a gentle  laxative such as double dose MiraLax, prune juice, or Milk of Magnesia.  Avoid using laxatives too often. Stool softeners can sometimes help counteract the constipating effects of narcotic pain medicines.  It can also cause diarrhea, so avoid using for too long. If you are still constipated despite taking fiber daily, eating solids, and a few doses of laxatives, call our office. Controlling diarrhea Try drinking liquids and eating bland foods for a few days to avoid stressing your intestines further. Avoid dairy products (especially milk & ice cream) for a short time.  The intestines often can lose the ability to digest lactose when stressed. Avoid foods that cause gassiness or bloating.  Typical foods include beans and other legumes, cabbage, broccoli, and dairy foods.  Avoid greasy, spicy, fast foods.  Every person has some sensitivity to other foods, so listen to your body and avoid those foods that trigger problems for you. Probiotics (such as active yogurt, Align, etc) may help repopulate the intestines and colon with normal bacteria and calm down a sensitive digestive tract Adding a fiber supplement gradually can help thicken stools by absorbing excess fluid and retrain the intestines to act more normally.  Slowly increase the dose over a few weeks.  Too much fiber too soon can backfire and cause cramping & bloating. It is okay to try and slow down diarrhea with a few doses of antidiarrheal medicines.   Bismuth subsalicylate (ex. Kayopectate, Pepto Bismol) for a few doses can help control diarrhea.  Avoid if pregnant.   Loperamide (Imodium) can slow down diarrhea.  Start with one tablet (2mg) first.  Avoid if you are having fevers or severe pain.  ILEOSTOMY PATIENTS WILL HAVE CHRONIC DIARRHEA since their colon is not in use.    Drink plenty of liquids.  You will need to drink even more glasses of water/liquid a day to avoid getting dehydrated. Record output from your ileostomy.  Expect to empty  the bag every 3-4 hours at first.  Most people with a permanent ileostomy empty their bag 4-6 times at the least.   Use antidiarrheal medicine (especially Imodium) several times a day to avoid getting dehydrated.  Start with a dose at bedtime & breakfast.  Adjust up or   down as needed.  Increase antidiarrheal medications as directed to avoid emptying the bag more than 8 times a day (every 3 hours). Work with your wound ostomy nurse to learn care for your ostomy.  See ostomy care instructions. TROUBLESHOOTING IRREGULAR BOWELS 1) Start with a soft & bland diet. No spicy, greasy, or fried foods.  2) Avoid gluten/wheat or dairy products from diet to see if symptoms improve. 3) Miralax 17gm or flax seed mixed in 8oz. water or juice-daily. May use 2-4 times a day as needed. 4) Gas-X, Phazyme, etc. as needed for gas & bloating.  5) Prilosec (omeprazole) over-the-counter as needed 6)  Consider probiotics (Align, Activa, etc) to help calm the bowels down  Call your doctor if you are getting worse or not getting better.  Sometimes further testing (cultures, endoscopy, X-ray studies, CT scans, bloodwork, etc.) may be needed to help diagnose and treat the cause of the diarrhea. [facility] [address], [address], [place], [place]  [address] [phone] - Main.    [phone]  - Toll Free.   [phone] - Fax [email or web address]   ###############################   #######################################################  Ostomy Support Information  You've heard that people get along just fine with only one of their eyes, or one of their lungs, or one of their kidneys. But you also know that you have only one intestine and only one bladder, and that leaves you feeling awfully empty, both physically and emotionally: You think no other people go around without part of their intestine with the ends of their intestines sticking out through their abdominal walls.    YOU ARE NOT ALONE.  There are nearly three quarters of a million people in the [place] who have an ostomy; people who have had surgery to remove all or part of their colons or bladders.   There is even a national association, the [employer] with over 350 local affiliated support groups that are organized by volunteers who provide peer support and counseling. [employer] has a toll free telephone num-ber, 800-826-0826 and an educational, interactive website, www.ostomy.org   An ostomy is an opening in the belly (abdominal wall) made by surgery. Ostomates are people who have had this procedure. The opening (stoma) allows the kidney or bowel to grdischarge waste. An external pouch covers the stoma to collect waste. Pouches are are a simple bag and are odor free. Different companies have disposable or reusable pouches to fit one's lifestyle. An ostomy can either be temporary or permanent.   THERE ARE THREE MAIN TYPES OF OSTOMIES Colostomy. A colostomy is a surgically created opening in the large intestine (colon). Ileostomy. An ileostomy is a surgically created opening in the small intestine. Urostomy. A urostomy is a surgically created opening to divert urine away from the bladder.  OSTOMY Care  The following guidelines will make care of your colostomy easier. Keep this information close by for quick reference.  Helpful DIET hints Eat a well-balanced diet including vegetables and fresh fruits. Eat on a regular schedule.  Drink at least 6 to 8 glasses of fluids daily. Eat slowly in a relaxed atmosphere. Chew your food thoroughly. Avoid chewing gum, smoking, and drinking from a straw. This will help decrease the amount of air you swallow, which may help reduce gas. Eating yogurt or drinking buttermilk may help reduce gas.  To control gas at night, do not eat after 8 p.m. This will give your bowel time to quiet down before you go   to bed.  If gas is a problem, you can purchase  Beano. Sprinkle Beano on the first bite of food before eating to reduce gas. It has no flavor and should not change the taste of your food. You can buy Beano over the counter at your local drugstore.  Foods like fish, onions, garlic, broccoli, asparagus, and cabbage produce odor. Although your pouch is odor-proof, if you eat these foods you may notice a stronger odor when emptying your pouch. If this is a concern, you may want to limit these foods in your diet.  If you have an ileostomy, you will have chronic diarrhea & need to drink more liquids to avoid getting dehydrated.  Consider antidiarrheal medicine like imodium (loperamide) or Lomotil to help slow down bowel movements / diarrhea into your ileostomy bag.  GETTING TO GOOD BOWEL HEALTH WITH AN ILEOSTOMY    With the colon bypassed & not in use, you will have small bowel diarrhea.   It is important to thicken & slow your bowel movements down.   The goal: 4-6 small BOWEL MOVEMENTS A DAY It is important to drink plenty of liquids to avoid getting dehydrated  CONTROLLING ILEOSTOMY DIARRHEA  TAKE A FIBER SUPPLEMENT (FiberCon or Benefiner soluble fiber) twice a day - to thicken stools by absorbing excess fluid and retrain the intestines to act more normally.  Slowly increase the dose over a few weeks.  Too much fiber too soon can backfire and cause cramping & bloating.  TAKE AN IRON SUPPLEMENT twice a day to naturally constipate your bowels.  Usually ferrous sulfate 325mg twice a day)  TAKE ANTI-DIARRHEAL MEDICINES: Loperamide (Imodium) can slow down diarrhea.  Start with two tablets (= 4mg) first and then try one tablet every 6 hours.  Can go up to 2 pills four times day (8 pills of 2mg max) Avoid if you are having fevers or severe pain.  If you are not better or start feeling worse, stop all medicines and call your doctor for advice LoMotil (Diphenoxylate / Atropine) is another medicine that can constipate & slow down bowel moevements Pepto  Bismol (bismuth) can gently thicken bowels as well  If diarrhea is worse,: drink plenty of liquids and try simpler foods for a few days to avoid stressing your intestines further. Avoid dairy products (especially milk & ice cream) for a short time.  The intestines often can lose the ability to digest lactose when stressed. Avoid foods that cause gassiness or bloating.  Typical foods include beans and other legumes, cabbage, broccoli, and dairy foods.  Every person has some sensitivity to other foods, so listen to our body and avoid those foods that trigger problems for you.Call your doctor if you are getting worse or not better.  Sometimes further testing (cultures, endoscopy, X-ray studies, bloodwork, etc) may be needed to help diagnose and treat the cause of the diarrhea. Take extra anti-diarrheal medicines (maximum is 8 pills of 2mg loperamide a day)   Tips for POUCHING an OSTOMY   Changing Your Pouch The best time to change your pouch is in the morning, before eating or drinking anything. Your stoma can function at any time, but it will function more after eating or drinking.   Applying the pouching system  Place all your equipment close at hand before removing your pouch.  Wash your hands.  Stand or sit in front of a mirror. Use the position that works best for you. Remember that you must keep the skin around the stoma   wrinkle-free for a good seal.  Gently remove the used pouch (1-piece system) or the pouch and old wafer (2-piece system). Empty the pouch into the toilet. Save the closure clip to use again.  Wash the stoma itself and the skin around the stoma. Your stoma may bleed a little when being washed. This is normal. Rinse and pat dry. You may use a wash cloth or soft paper towels (like Bounty), mild soap (like Dial, Safeguard, or Ivory), and water. Avoid soaps that contain perfumes or lotions.  For a new pouch (1-piece system) or a new wafer (2-piece system), measure your  stoma using the stoma guide in each box of supplies.  Trace the shape of your stoma onto the back of the new pouch or the back of the new wafer. Cut out the opening. Remove the paper backing and set it aside.  Optional: Apply a skin barrier powder to surrounding skin if it is irritated (bare or weeping), and dust off the excess. Optional: Apply a skin-prep wipe (such as Skin Prep or All-Kare) to the skin around the stoma, and let it dry. Do not apply this solution if the skin is irritated (red, tender, or broken) or if you have shaved around the stoma. Optional: Apply a skin barrier paste (such as Stomahesive, Coloplast, or Premium) around the opening cut in the back of the pouch or wafer. Allow it to dry for 30 to 60 seconds.  Hold the pouch (1-piece system) or wafer (2-piece system) with the sticky side toward your body. Make sure the skin around the stoma is wrinkle-free. Center the opening on the stoma, then press firmly to your abdomen (Fig. 4). Look in the mirror to check if you are placing the pouch, or wafer, in the right position. For a 2-piece system, snap the pouch onto the wafer. Make sure it snaps into place securely.  Place your hand over the stoma and the pouch or wafer for about 30 seconds. The heat from your hand can help the pouch or wafer stick to your skin.  Add deodorant (such as Super Banish or Nullo) to your pouch. Other options include food extracts such as vanilla oil and peppermint extract. Add about 10 drops of the deodorant to the pouch. Then apply the closure clamp. Note: Do not use toxic  chemicals or commercial cleaning agents in your pouch. These substances may harm the stoma.  Optional: For extra seal, apply tape to all 4 sides around the pouch or wafer, as if you were framing a picture. You may use any brand of medical adhesive tape. Change your pouch every 5 to 7 days. Change it immediately if a leak occurs.  Wash your hands afterwards.  If you are wearing a  2-piece system, you may use 2 new pouches per week and alternate them. Rinse the pouch with mild soap and warm water and hang it to dry for the next day. Apply the fresh pouch. Alternate the 2 pouches like this for a week. After a week, change the wafer and begin with 2 new pouches. Place the old pouches in a plastic bag, and put them in the trash.   LIVING WITH AN OSTOMY  Emptying Your Pouch Empty your pouch when it is one-third full (of urine, stool, and/or gas). If you wait until your pouch is fuller than this, it will be more difficult to empty and more noticeable. When you empty your pouch, either put toilet paper in the toilet bowl first, or flush the   toilet while you empty the pouch. This will reduce splashing. You can empty the pouch between your legs or to one side while sitting, or while standing or stooping. If you have a 2-piece system, you can snap off the pouch to empty it. Remember that your stoma may function during this time. If you wish to rinse your pouch after you empty it, a turkey baster can be helpful. When using a baster, squirt water up into the pouch through the opening at the bottom. With a 2-piece system, you can snap off the pouch to rinse it. After rinsing  your pouch, empty it into the toilet. When rinsing your pouch at home, put a few granules of Dreft soap in the rinse water. This helps lubricate and freshen your pouch. The inside of your pouch can be sprayed with non-stick cooking oil (Pam spray). This may help reduce stool sticking to the inside of the pouch.  Bathing You may shower or bathe with your pouch on or off. Remember that your stoma may function during this time.  The materials you use to wash your stoma and the skin around it should be clean, but they do not need to be sterile.  Wearing Your Pouch During hot weather, or if you perspire a lot in general, wear a cover over your pouch. This may prevent a rash on your skin under the pouch. Pouch covers are  sold at ostomy supply stores. Wear the pouch inside your underwear for better support. Watch your weight. Any gain or loss of 10 to 15 pounds or more can change the way your pouch fits.  Going Away From Home A collapsible cup (like those that come in travel kits) or a soft plastic squirt bottle with a pull-up top (like a travel bottle for shampoo) can be used for rinsing your pouch when you are away from home. Tilt the opening of the pouch at an upward angle when using a cup to rinse.  Carry wet wipes or extra tissues to use in public bathrooms.  Carry an extra pouching system with you at all times.  Never keep ostomy supplies in the glove compartment of your car. Extreme heat or cold can damage the skin barriers and adhesive wafers on the pouch.  When you travel, carry your ostomy supplies with you at all times. Keep them within easy reach. Do not pack ostomy supplies in baggage that will be checked or otherwise separated from you, because your baggage might be lost. If you're traveling out of the country, it is helpful to have a letter stating that you are carrying ostomy supplies as a medical necessity.  If you need ostomy supplies while traveling, look in the yellow pages of the telephone book under "Surgical Supplies." Or call the local ostomy organization to find out where supplies are available.  Do not let your ostomy supplies get low. Always order new pouches before you use the last one.  Reducing Odor Limit foods such as broccoli, cabbage, onions, fish, and garlic in your diet to help reduce odor. Each time you empty your pouch, carefully clean the opening of the pouch, both inside and outside, with toilet paper. Rinse your pouch 1 or 2 times daily after you empty it (see directions for emptying your pouch and going away from home). Add deodorant (such as Super Banish or Nullo) to your pouch. Use air deodorizers in your bathroom. Do not add aspirin to your pouch. Even though  aspirin can help prevent odor, it   could cause ulcers on your stoma.  When to call the doctor Call the doctor if you have any of the following symptoms: Purple, black, or white stoma Severe cramps lasting more than 6 hours Severe watery discharge from the stoma lasting more than 6 hours No output from the colostomy for 3 days Excessive bleeding from your stoma Swelling of your stoma to more than 1/2-inch larger than usual Pulling inward of your stoma below skin level Severe skin irritation or deep ulcers Bulging or other changes in your abdomen  When to call your ostomy nurse Call your ostomy/enterostomal therapy (WOCN) nurse if any of the following occurs: Frequent leaking of your pouching system Change in size or appearance of your stoma, causing discomfort or problems with your pouch Skin rash or rawness Weight gain or loss that causes problems with your pouch     FREQUENTLY ASKED QUESTIONS   Why haven't you met any of these folks who have an ostomy?  Well, maybe you have! You just did not recognize them because an ostomy doesn't show. It can be kept secret if you wish. Why, maybe some of your best friends, office associates or neighbors have an ostomy ... you never can tell. People facing ostomy surgery have many quality-of-life questions like: Will you bulge? Smell? Make noises? Will you feel waste leaving your body? Will you be a captive of the toilet? Will you starve? Be a social outcast? Get/stay married? Have babies? Easily bathe, go swimming, bend over?  OK, let's look at what you can expect:   Will you bulge?  Remember, without part of the intestine or bladder, and its contents, you should have a flatter tummy than before. You can expect to wear, with little exception, what you wore before surgery ... and this in-cludes tight clothing and bathing suits.   Will you smell?  Today, thanks to modern odor proof pouching systems, you can walk into an ostomy support group  meeting and not smell anything that is foul or offensive. And, for those with an ileostomy or colostomy who are concerned about odor when emptying their pouch, there are in-pouch deodorants that can be used to eliminate any waste odors that may exist.   Will you make noises?  Everyone produces gas, especially if they are an air-swallower. But intestinal sounds that occur from time to time are no differ-ent than a gurgling tummy, and quite often your clothing will muffle any sounds.   Will you feel the waste discharges?  For those with a colostomy or ileostomy there might be a slight pressure when waste leaves your body, but understand that the intestines have no nerve endings, so there will be no unpleasant sensations. Those with a urostomy will probably be unaware of any kidney drainage.   Will you be a captive of the toilet?  Immediately post-op you will spend more time in the bathroom than you will after your body recovers from surgery. Every person is different, but on average those with an ileostomy or urostomy may empty their pouches 4 to 6 times a day; a little  less if you have a colostomy. The average wear time between pouch system changes is 3 to 5 days and the changing process should take less than 30 minutes.   Will I need to be on a special diet? Most people return to their normal diet when they have recovered from surgery. Be sure to chew your food well, eat a well-balanced diet and drink plenty of fluids. If   you experience problems with a certain food, wait a couple of weeks and try it again.  Will there be odor and noises? Pouching systems are designed to be odor-proof or odor-resistant. There are deodorants that can be used in the pouch. Medications are also available to help reduce odor. Limit gas-producing foods and carbonated beverages. You will experience less gas and fewer noises as you heal from surgery.  How much time will it take to care for my ostomy? At first, you may  spend a lot of time learning about your ostomy and how to take care of it. As you become more comfortable and skilled at changing the pouching system, it will take very little time to care for it.   Will I be able to return to work? People with ostomies can perform most jobs. As soon as you have healed from surgery, you should be able to return to work. Heavy lifting (more than 10 pounds) may be discouraged.   What about intimacy? Sexual relationships and intimacy are important and fulfilling aspects of your life. They should continue after ostomy surgery. Intimacy-related concerns should be discussed openly between you and your partner.   Can I wear regular clothing? You do not need to wear special clothing. Ostomy pouches are fairly flat and barely noticeable. Elastic undergarments will not hurt the stoma or prevent the ostomy from functioning.   Can I participate in sports? An ostomy should not limit your involvement in sports. Many people with ostomies are runners, skiers, swimmers or participate in other active lifestyles. Talk with your caregiver first before doing heavy physical activity.  Will you starve?  Not if you follow doctor's orders at each stage of your post-op adjustment. There is no such thing as an "ostomy diet". Some people with an ostomy will be able to eat and tolerate anything; others may find diffi-culty with some foods. Each person is an individual and must determine, by trial, what is best for them. A good practice for all is to drink plenty of water.   Will you be a social outcast?  Have you met anyone who has an ostomy and is a social outcast? Why should you be the first? Only your attitude and self image will effect how you are treated. No confi-dent person is an outcast.    PROFESSIONAL HELP   Resources are available if you need help or have questions about your ostomy.   Specially trained nurses called Wound, Ostomy Continence Nurses (WOCN) are available for  consultation in most major medical centers.  Consider getting an ostomy consult at an outpatient ostomy clinic.   [place] has an Ostomy Clinic run by an WOCN ostomy nurse at the [facility].  [phone]. [facility] can help set up an appointment   The [employer] Ost[employer] Association (UOA) is a group made up of many local chapters throughout the [place]. These local groups hold meetings and provide support to prospective and existing ostomates. They sponsor educational events and have qualified visitors to make personal or telephone visits. Contact the UOA for the chapter nearest you and for other educational publications.  More detailed information can be found in Colostomy Guide, a publication of the United Ostomy Association (UOA). Contact UOA at 1-800-826-0826 or visit their web site at www.uoaa.org. The website contains links to other sites, suppliers and resources.  [facility] Secure Start Services: Start at the website to enlist for support.  Your Wound Ostomy (WOCN) nurse may have started this   process. [email or web address]://[email or web address].[email or web address].com/en/securestart Secure Start services are designed to support people as they live their lives with an ostomy or neurogenic bladder. Enrolling is easy and at no cost to the patient. We realize that each person's needs and life journey are different. Through Secure Start services, we want to help people live their life, their way.  #######################################################  

## 2021-06-02 NOTE — H&P (Signed)
The patient is a 50 year old female who presents with colorectal cancer. Interpreter declined.  Pt wishes to use her son.  50 year old female who recently underwent a colonoscopy due to rectal bleeding.  She was noted to have a hypertrophied anal papilla, which was felt to be the source of her bleeding.  She was also noted to have 2 medium-sized sigmoid polyps.  The more proximal polyp was on a stalk and resected completely.  A surgical clip was placed across this site.  The more distal sigmoid polyp was resected piecemeal and tattooed.  Pathology showed invasive adenocarcinoma.  Margins were noted to be negative on pathology report.  The report also indicates multiple areas of invasive carcinoma across multiple pieces.  Patient denies any difficulty with her bowel habits.  She has never had any abdominal surgeries.     Past Surgical History ([place]; 04/27/2021 2:01 PM) Colon Polyp Removal - Colonoscopy    Diagnostic Studies History (Jocelyn Bond, CMA; 04/27/2021 2:01 PM) Mammogram  >3 years ago   Allergies (Jocelyn Bond, CMA; 04/27/2021 2:00 PM) No Known Drug Allergies  [04/27/2021]: Allergies Reconciled    Medication History (Jocelyn Bond, CMA; 04/27/2021 2:00 PM) Vitamin C  (250MG  Tablet, Oral) Active. Iodine (Kelp)  (Oral) Active. Medications Reconciled    Social History (Jocelyn Bond, CMA; 04/27/2021 2:01 PM) Caffeine use  Tea. No alcohol use    Family History Karl Bales, CMA; 04/27/2021 2:01 PM) First Degree Relatives  No pertinent family history    Pregnancy / Birth History Karl Bales, CMA; 04/27/2021 2:01 PM) Age at menarche  41 years. Age of menopause  42-50 Gravida  3 Irregular periods  Maternal age  15-35 Para  3   Other Problems (Jocelyn Bond, CMA; 04/27/2021 2:01 PM) No pertinent past medical history          Review of Systems  Skin Not Present- Change in Wart/Mole, Dryness, Hives, Jaundice, New Lesions, Non-Healing Wounds, Rash and  Ulcer. Gastrointestinal Not Present- Abdominal Pain, Bloating, Bloody Stool, Change in Bowel Habits, Chronic diarrhea, Constipation, Difficulty Swallowing, Excessive gas, Gets full quickly at meals, Hemorrhoids, Indigestion, Nausea, Rectal Pain and Vomiting. Female Genitourinary Not Present- Frequency, Nocturia, Painful Urination, Pelvic Pain and Urgency. Musculoskeletal Not Present- Back Pain, Joint Pain, Joint Stiffness, Muscle Pain, Muscle Weakness and Swelling of Extremities.   BP 124/86   Pulse 62   Temp 98.1 F (36.7 C) (Oral)   Resp 18   Ht 5' (1.524 m)   Wt 52.2 kg   LMP 11/27/2012   SpO2 100%   BMI 22.46 kg/m         Physical Exam    General Mental Status - Alert. General Appearance - Cooperative. CV: RRR Lungs: CTA Abdomen Palpation/Percussion Palpation and Percussion of the abdomen reveal - Soft and Non Tender.       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 The invasive component was seen across multiple sections and extended into the neck of the polyp. The area of resection was tattooed. CEA and CT scans of the chest, abdomen and pelvis show no sign of metastatic disease. Given that the patient is low risk for complications of surgery and the possibility of residual cancer or carcinoma within the lymph nodes is present, I have recommended a robotic-assisted partial colectomy. This will most likely be a straightforward sigmoid resection. I  discussed this with the patient and her son in detail. All questions were answered. The surgery and anatomy were described to the patient as well as the risks of surgery and the possible complications. These include: Bleeding, deep abdominal infections and possible wound complications such as hernia and infection, damage to adjacent structures, leak of surgical connections,  which can lead to other surgeries and possibly an ostomy, possible need for other procedures, such as abscess drains in radiology, possible prolonged hospital stay, possible diarrhea from removal of part of the colon, possible constipation from narcotics, possible bowel, bladder or sexual dysfunction if having rectal surgery, prolonged fatigue/weakness or appetite loss, possible early recurrence of of disease, possible complications of their medical problems such as heart disease or arrhythmias or lung problems, death (less than 1%). I believe the patient understands and wishes to proceed with the surgery.

## 2021-06-02 NOTE — Op Note (Signed)
06/02/2021  9:40 AM  PATIENT:  Gabrielle Robertson  50 y.o. female  Patient Care Team: System, Provider Not In as PCP - General  PRE-OPERATIVE DIAGNOSIS:  colon cancer  POST-OPERATIVE DIAGNOSIS:  colon cancer  PROCEDURE:  XI ROBOT LAPAROSCOPIC SIGMOIDECTOMY    Surgeon(s): Leighton Ruff, MD Michael Boston, MD  ASSISTANT: Dr Johney Maine   ANESTHESIA:   local and general  EBL: 8ml Total I/O In: 1000 [I.V.:1000] Out: 75 [Urine:25; Blood:50]  Delay start of Pharmacological VTE agent (>24hrs) due to surgical blood loss or risk of bleeding:  no  DRAINS: none   SPECIMEN:  sigmoid colon, final proximal and distal margins  DISPOSITION OF SPECIMEN:  PATHOLOGY  COUNTS:  YES  PLAN OF CARE: Admit to inpatient   PATIENT DISPOSITION:  PACU - hemodynamically stable.  INDICATION:    50 y.o. F with colon cancer within a polyp, unable to assess margins.  I recommended segmental resection:  The anatomy & physiology of the digestive tract was discussed.  The pathophysiology was discussed.  Natural history risks without surgery was discussed.   I worked to give an overview of the disease and the frequent need to have multispecialty involvement.  I feel the risks of no intervention will lead to serious problems that outweigh the operative risks; therefore, I recommended a partial colectomy to remove the pathology.  Laparoscopic & open techniques were discussed.   Risks such as bleeding, infection, abscess, leak, reoperation, possible ostomy, hernia, heart attack, death, and other risks were discussed.  I noted a good likelihood this will help address the problem.   Goals of post-operative recovery were discussed as well.    The patient expressed understanding & wished to proceed with surgery.  OR FINDINGS:   Patient had tattoo noted in distal and mid sigmoid colon  No obvious metastatic disease on visceral parietal peritoneum or liver.  The anastomosis rests ~13 cm from the anal verge by rigid  proctoscopy.  DESCRIPTION:   Informed consent was confirmed.  The patient underwent general anaesthesia without difficulty.  The patient was positioned appropriately.  VTE prevention in place.  The patient's abdomen was clipped, prepped, & draped in a sterile fashion.  Surgical timeout confirmed our plan.  The patient was positioned in reverse Trendelenburg.  Abdominal entry was gained using a Varies needle in the LUQ.  Entry was clean.  I induced carbon dioxide insufflation.  An 64mm robotic port was placed in the RUQ.  Camera inspection revealed no injury.  Extra ports were carefully placed under direct laparoscopic visualization.  I laparoscopically reflected the greater omentum and the upper abdomen the small bowel in the upper abdomen. The patient was appropriately positioned and the robot was docked to the patient's left side.  Instruments were placed under direct visualization.    I mobilized the sigmoid colon off of the pelvic sidewall.  I scored the base of peritoneum of the right side of the mesentery of the left colon from the ligament of Treitz to the peritoneal reflection of the mid rectum.  The patient had tattoo noted in the mid and distal sigmoid colon.  I elevated the sigmoid mesentery and enetered into the retro-mesenteric plane. We were able to identify the left ureter and gonadal vessels. We kept those posterior within the retroperitoneum and elevated the left colon mesentery off that. I did isolated IMA pedicle but did not ligate it yet.  I continued distally and got into the avascular plane posterior to the mesorectum. This allowed me to  help mobilize the rectum as well by freeing the mesorectum off the sacrum.  I mobilized the peritoneal coverings towards the peritoneal reflection on both the right and left sides of the rectum.  I could see the right and left ureters and stayed away from them.    I skeletonized the inferior mesenteric artery pedicle.  I went down to its takeoff from  the aorta.   I isolated the inferior mesenteric vein off of the ligament of Treitz just cephalad to that as well.  After confirming the left ureter was out of the way, I went ahead and ligated the inferior mesenteric artery pedicle with bipolar robotic vessel sealer ~2cm above its takeoff from the aorta.  We ensured hemostasis. I skeletonized the mesorectum at the junction at the proximal rectum using blunt dissection & bipolar robotic vessel sealer.  I mobilized the left colon in a lateral to medial fashion off the line of Toldt up towards the splenic flexure to ensure good mobilization of the left colon to reach into the pelvis.  I divided the mesentery with the vessel sealer up to the level of the descending sigmoid colon junction.  I then used blue load 60 mm robotic stapler to divide the rectosigmoid junction.   At this point the robot was undocked.  The 12 mm suprapubic port was enlarged into a Pfannenstiel incision and an [place] wound protector was placed.  The colon was brought out through this wound and transected over a pursestring device.  There was good pulsatile bleeding noted at the margin.  A 2-0 Prolene pursestring suture was placed and this was secured with interrupted 3-0 silk sutures.  A 29 mm EEA anvil was placed into the colon and the pursestring was tied tightly around this.  This was then placed back into the abdomen and her specimen was sent to pathology for further examination.  The Alexis wound protector was placed and an anastomosis was created under laparoscopic visualization from the EEA stapler.  There was no tension on the anastomosis.  There was no leak when tested with insufflation under irrigation.  The abdomen was irrigated.  Hemostasis was good.  The omentum was then brought down over the abdominal contents and the robotic ports were removed.  We then switched to clean gowns, gloves, instruments and drapes.  The peritoneum of the Pfannenstiel incision was then closed using a 0  Vicryl running suture.  The fascia was closed using interrupted #1 Novafil sutures.  The subcutaneous tissue was reapproximated using a running 2-0 Vicryl suture and the skin was closed using a running 4-0 Vicryl subcuticular suture.  A sterile dressing was applied.  The remaining port sites were closed using interrupted 4-0 Vicryl sutures and Dermabond.  The patient was awakened from anesthesia and sent to the postanesthesia care unit in stable condition.  All counts were correct per operating room staff.  An MD assistant was necessary for tissue manipulation, retraction and positioning due to the complexity of the case and hospital policies

## 2021-06-02 NOTE — Anesthesia Postprocedure Evaluation (Signed)
Anesthesia Post Note  Patient: Gabrielle Robertson  Procedure(s) Performed: XI ROBOT ASSISTED LAPAROSCOPIC SIGMOIDECTOMY     Patient location during evaluation: PACU Anesthesia Type: General Level of consciousness: awake and alert Pain management: pain level controlled Vital Signs Assessment: post-procedure vital signs reviewed and stable Respiratory status: spontaneous breathing, nonlabored ventilation, respiratory function stable and patient connected to nasal cannula oxygen Cardiovascular status: blood pressure returned to baseline and stable Postop Assessment: no apparent nausea or vomiting Anesthetic complications: no   No notable events documented.  Last Vitals:  Vitals:   06/02/21 1100 06/02/21 1200  BP: 122/83 129/84  Pulse: 74 76  Resp: 12 13  Temp: 36.4 C   SpO2: 100% 100%    Last Pain:  Vitals:   06/02/21 1233  TempSrc:   PainSc: 5                  Ozil Stettler L Ilo Beamon

## 2021-06-02 NOTE — Transfer of Care (Signed)
Immediate Anesthesia Transfer of Care Note  Patient: Gabrielle Robertson  Procedure(s) Performed: Procedure(s): XI ROBOT ASSISTED LAPAROSCOPIC SIGMOIDECTOMY (N/A)  Patient Location: PACU  Anesthesia Type:General  Level of Consciousness: Patient sleeping, comfortable, responds to stimulation.   Airway & Oxygen Therapy: Patient spontaneously breathing, ventilating well, oxygen via simple oxygen mask.  Post-op Assessment: Report given to PACU RN, vital signs reviewed and stable, moving all extremities.   Post vital signs: Reviewed and stable.  Complications: No apparent anesthesia complications Last Vitals:  Vitals Value Taken Time  BP 124/78 06/02/21 1005  Temp    Pulse 83 06/02/21 1007  Resp 15 06/02/21 1007  SpO2 100 % 06/02/21 1007  Vitals shown include unvalidated device data.  Last Pain:  Vitals:   06/02/21 0611  TempSrc: Oral  PainSc: 0-No pain      Patients Stated Pain Goal: 3 (97/47/18 5501)  Complications: No notable events documented.

## 2021-06-02 NOTE — Anesthesia Procedure Notes (Signed)
Procedure Name: Intubation Date/Time: 06/02/2021 7:45 AM Performed by: Deliah Boston, CRNA Pre-anesthesia Checklist: Patient identified, Emergency Drugs available, Suction available and Patient being monitored Patient Re-evaluated:Patient Re-evaluated prior to induction Oxygen Delivery Method: Circle system utilized Preoxygenation: Pre-oxygenation with 100% oxygen Induction Type: IV induction Ventilation: Mask ventilation without difficulty Laryngoscope Size: Mac and 3 Grade View: Grade I Tube type: Oral Tube size: 7.0 mm Number of attempts: 1 Airway Equipment and Method: Stylet and Oral airway Placement Confirmation: ETT inserted through vocal cords under direct vision, positive ETCO2 and breath sounds checked- equal and bilateral Secured at: 19 cm Tube secured with: Tape Dental Injury: Teeth and Oropharynx as per pre-operative assessment

## 2021-06-03 LAB — BASIC METABOLIC PANEL
Anion gap: 9 (ref 5–15)
BUN: 9 mg/dL (ref 6–20)
CO2: 27 mmol/L (ref 22–32)
Calcium: 9.5 mg/dL (ref 8.9–10.3)
Chloride: 105 mmol/L (ref 98–111)
Creatinine, Ser: 0.87 mg/dL (ref 0.44–1.00)
GFR, Estimated: >60 mL/min (ref >60)
Glucose, Bld: 110 mg/dL — ABNORMAL HIGH (ref 70–99)
Potassium: 4.1 mmol/L (ref 3.5–5.1)
Sodium: 141 mmol/L (ref 135–145)

## 2021-06-03 LAB — CBC
HCT: 39 % (ref 36.0–46.0)
Hemoglobin: 12.7 g/dL (ref 12.0–15.0)
MCH: 29.8 pg (ref 26.0–34.0)
MCHC: 32.6 g/dL (ref 30.0–36.0)
MCV: 91.5 fL (ref 80.0–100.0)
Platelets: 355 10*3/uL (ref 150–400)
RBC: 4.26 MIL/uL (ref 3.87–5.11)
RDW: 12.9 % (ref 11.5–15.5)
WBC: 15.8 10*3/uL — ABNORMAL HIGH (ref 4.0–10.5)
nRBC: 0 % (ref 0.0–0.2)

## 2021-06-03 MED ORDER — TRAMADOL HCL 50 MG PO TABS
50.0000 mg | ORAL_TABLET | Freq: Four times a day (QID) | ORAL | Status: DC | PRN
  Administered 2021-06-03: 50 mg via ORAL
  Filled 2021-06-03: qty 1

## 2021-06-03 NOTE — Progress Notes (Signed)
1 Day Post-Op robotic sigmoidectomy Subjective: No complaints, ambulating in hall, tolerating clears, denies nausea  Objective: Vital signs in last 24 hours: Temp:  [96.8 F (36 C)-98.3 F (36.8 C)] 98.1 F (36.7 C) (06/24 0513) Pulse Rate:  [69-86] 69 (06/24 0513) Resp:  [10-17] 17 (06/24 0513) BP: (97-135)/(67-90) 97/76 (06/24 0513) SpO2:  [100 %] 100 % (06/24 0513) Weight:  [55.9 kg] 55.9 kg (06/24 0500)   Intake/Output from previous day: 06/23 0701 - 06/24 0700 In: 2774.2 [P.O.:240; I.V.:2534.2] Out: 5425 [WOEHO:1224; Blood:50] Intake/Output this shift: No intake/output data recorded.   General appearance: alert and cooperative GI: normal findings: soft, non-tender  Incision: no significant drainage  Lab Results:  Recent Labs    06/03/21 0414  WBC 15.8*  HGB 12.7  HCT 39.0  PLT 355   BMET Recent Labs    06/03/21 0414  NA 141  K 4.1  CL 105  CO2 27  GLUCOSE 110*  BUN 9  CREATININE 0.87  CALCIUM 9.5   PT/INR No results for input(s): LABPROT, INR in the last 72 hours. ABG No results for input(s): PHART, HCO3 in the last 72 hours.  Invalid input(s): PCO2, PO2  MEDS, Scheduled  acetaminophen  1,000 mg Oral Q6H   alvimopan  12 mg Oral BID   Chlorhexidine Gluconate Cloth  6 each Topical Daily   enoxaparin (LOVENOX) injection  40 mg Subcutaneous Q24H   feeding supplement  237 mL Oral BID BM   gabapentin  100 mg Oral BID   saccharomyces boulardii  250 mg Oral BID    Studies/Results: No results found.  Assessment: s/p Procedure(s): XI ROBOT ASSISTED LAPAROSCOPIC SIGMOIDECTOMY Patient Active Problem List   Diagnosis Date Noted   Colon cancer ([place]) 06/02/2021   Rectal bleeding 04/04/2021   HYPOTHYROIDISM 03/01/2010   HYPERLIPIDEMIA 03/01/2010    Expected post op course  Plan: d/c foley Advance diet as tolerated Cont ambulation   LOS: 1 day     .Rosario Adie, MD [facility], [place]    06/03/2021 7:40 AM

## 2021-06-04 LAB — BASIC METABOLIC PANEL
Anion gap: 6 (ref 5–15)
BUN: 11 mg/dL (ref 6–20)
CO2: 29 mmol/L (ref 22–32)
Calcium: 9.1 mg/dL (ref 8.9–10.3)
Chloride: 101 mmol/L (ref 98–111)
Creatinine, Ser: 0.86 mg/dL (ref 0.44–1.00)
GFR, Estimated: >60 mL/min (ref >60)
Glucose, Bld: 95 mg/dL (ref 70–99)
Potassium: 3.8 mmol/L (ref 3.5–5.1)
Sodium: 136 mmol/L (ref 135–145)

## 2021-06-04 LAB — CBC
HCT: 39.7 % (ref 36.0–46.0)
Hemoglobin: 13 g/dL (ref 12.0–15.0)
MCH: 30.6 pg (ref 26.0–34.0)
MCHC: 32.7 g/dL (ref 30.0–36.0)
MCV: 93.4 fL (ref 80.0–100.0)
Platelets: 350 10*3/uL (ref 150–400)
RBC: 4.25 MIL/uL (ref 3.87–5.11)
RDW: 13.2 % (ref 11.5–15.5)
WBC: 11.9 10*3/uL — ABNORMAL HIGH (ref 4.0–10.5)
nRBC: 0 % (ref 0.0–0.2)

## 2021-06-04 MED ORDER — LACTATED RINGERS IV BOLUS: 1000.0000 mL | Freq: Three times a day (TID) | INTRAVENOUS | Status: DC | PRN

## 2021-06-04 MED ORDER — CALCIUM POLYCARBOPHIL 625 MG PO TABS
625.0000 mg | ORAL_TABLET | Freq: Two times a day (BID) | ORAL | Status: DC
  Administered 2021-06-04 – 2021-06-05 (×3): 625 mg via ORAL
  Filled 2021-06-04 (×3): qty 1

## 2021-06-04 MED ORDER — LIP MEDEX EX OINT
1.0000 "application " | TOPICAL_OINTMENT | Freq: Two times a day (BID) | CUTANEOUS | Status: DC
  Administered 2021-06-04 – 2021-06-05 (×3): 1 via TOPICAL
  Filled 2021-06-04: qty 7

## 2021-06-04 MED ORDER — MAGIC MOUTHWASH
15.0000 mL | Freq: Four times a day (QID) | ORAL | Status: DC | PRN
  Filled 2021-06-04: qty 15

## 2021-06-04 MED ORDER — METHOCARBAMOL 1000 MG/10ML IJ SOLN
1000.0000 mg | Freq: Four times a day (QID) | INTRAVENOUS | Status: DC | PRN
  Filled 2021-06-04: qty 10

## 2021-06-04 MED ORDER — METHOCARBAMOL 500 MG PO TABS: 1000.0000 mg | ORAL_TABLET | Freq: Four times a day (QID) | ORAL | Status: DC | PRN

## 2021-06-04 NOTE — Progress Notes (Signed)
Francenia Robertson [id number] August 31, 1971  CARE TEAM:  PCP: System, Provider Not In  Outpatient Care Team: Patient Care Team: System, Provider Not In as PCP - General  Inpatient Treatment Team: Treatment Team: Attending Provider: Leighton Ruff, MD; Technician: Ernest Mallick, NT; Technician: Abbe Amsterdam, NT; Registered Nurse: Kurtis Bushman, RN; Utilization Review: Alease Medina, RN   Problem List:   Active Problems:   Colon cancer [facility])   2 Days Post-Op  06/02/2021 POST-OPERATIVE DIAGNOSIS:  colon cancer   PROCEDURE:  XI ROBOT LAPAROSCOPIC SIGMOIDECTOMY     Surgeon(s): Leighton Ruff, MD    Assessment  Slowly recovering  [facility] Stay = 2 days)  Plan:  Answer further pathway  Advance diet  Bowel regimen.  Improved pain control.    Follow-up on pathology -VTE prophylaxis- SCDs, etc -mobilize as tolerated to help recovery  Disposition:  Disposition:  The patient is from: Home  Anticipate discharge to:  Home  Anticipated Date of Discharge is:  June 26,2022    Barriers to discharge:  Pending Clinical improvement (more likely than not)  Patient currently is NOT MEDICALLY STABLE for discharge from the hospital from a surgery standpoint.      20 minutes spent in review, evaluation, examination, counseling, and coordination of care.   I have reviewed this patient's available data, including medical history, events of note, physical examination and test results as part of my evaluation.  A significant portion of that time was spent in counseling.  Care during the described time interval was provided by me.  06/04/2021    Subjective: (Chief complaint)  Sore.  Not getting up as much.  Started to have some appetite.  Objective:  Vital signs:  Vitals:   06/03/21 1351 06/03/21 2142 06/04/21 0500 06/04/21 0601  BP: 101/71 105/75  100/69  Pulse: 73 79  70  Resp: 18 16  18   Temp: 97.8 Gabrielle (36.6 C) 99 Gabrielle (37.2 C)  99.1 Gabrielle (37.3 C)   TempSrc: Oral Oral  Oral  SpO2: 98% 97%  96%  Weight:   57.7 kg   Height:        Last BM Date: 06/01/21  Intake/Output   Yesterday:  06/24 0701 - 06/25 0700 In: 770 [P.O.:770] Out: 5427 [Urine:1650] This shift:  Total I/O In: 170 [P.O.:170] Out: 600 [Urine:600]  Bowel function:  Flatus: No  BM:  No  Drain: (No drain)   Physical Exam:  General: Pt awake/alert in no acute distress Eyes: PERRL, normal EOM.  Sclera clear.  No icterus Neuro: CN II-XII intact w/o focal sensory/motor deficits. Lymph: No head/neck/groin lymphadenopathy Psych:  No delerium/psychosis/paranoia.  Oriented x 4 HENT: Normocephalic, Mucus membranes moist.  No thrush Neck: Supple, No tracheal deviation.  No obvious thyromegaly Chest: No pain to chest wall compression.  Good respiratory excursion.  No audible wheezing CV:  Pulses intact.  Regular rhythm.  No major extremity edema MS: Normal AROM mjr joints.  No obvious deformity  Abdomen: Soft.  Nondistended.  Mildly tender at incisions only.  No evidence of peritonitis.  No incarcerated hernias.  Ext:   No deformity.  No mjr edema.  No cyanosis Skin: No petechiae / purpurea.  No major sores.  Warm and dry    Results:   Cultures: Recent Results (from the past 720 hour(s))  SARS CORONAVIRUS 2 (TAT 6-24 HRS) Nasopharyngeal Nasopharyngeal Swab     Status: None   Collection Time: 05/30/21  8:54 AM   Specimen: Nasopharyngeal Swab  Result Value  Ref Range Status   SARS Coronavirus 2 NEGATIVE NEGATIVE Final    Comment: (NOTE) SARS-CoV-2 target nucleic acids are NOT DETECTED.  The SARS-CoV-2 RNA is generally detectable in upper and lower respiratory specimens during the acute phase of infection. Negative results do not preclude SARS-CoV-2 infection, do not rule out co-infections with other pathogens, and should not be used as the sole basis for treatment or other patient management decisions. Negative results must be combined with clinical  observations, patient history, and epidemiological information. The expected result is Negative.  Fact Sheet for Patients: SugarRoll.be  Fact Sheet for Healthcare Providers: https://www.woods-mathews.com/  This test is not yet approved or cleared by the [place] FDA and  has been authorized for detection and/or diagnosis of SARS-CoV-2 by FDA under an Emergency Use Authorization (EUA). This EUA will remain  in effect (meaning this test can be used) for the duration of the COVID-19 declaration under Se ction 564(b)(1) of the Act, 21 U.S.C. section 360bbb-3(b)(1), unless the authorization is terminated or revoked sooner.  Performed at [facility], [place] [address] [place], [place] [address]     Labs: Results for orders placed or performed during the hospital encounter of 06/02/21 (from the past 48 hour(s))  CBC     Status: Abnormal   Collection Time: 06/03/21  4:14 AM  Result Value Ref Range   WBC 15.8 (H) 4.0 - 10.5 K/uL   RBC 4.26 3.87 - 5.11 MIL/uL   Hemoglobin 12.7 12.0 - 15.0 g/dL   HCT 39.0 36.0 - 46.0 %   MCV 91.5 80.0 - 100.0 fL   MCH 29.8 26.0 - 34.0 pg   MCHC 32.6 30.0 - 36.0 g/dL   RDW 12.9 11.5 - 15.5 %   Platelets 355 150 - 400 K/uL   nRBC 0.0 0.0 - 0.2 %    Comment: Performed at [facility], [place] [address], [place], [place] [address]  Basic metabolic panel     Status: Abnormal   Collection Time: 06/03/21  4:14 AM  Result Value Ref Range   Sodium 141 135 - 145 mmol/L   Potassium 4.1 3.5 - 5.1 mmol/L   Chloride 105 98 - 111 mmol/L   CO2 27 22 - 32 mmol/L   Glucose, Bld 110 (H) 70 - 99 mg/dL    Comment: Glucose reference range applies only to samples taken after fasting for at least 8 hours.   BUN 9 6 - 20 mg/dL   Creatinine, Ser 0.87 0.44 - 1.00 mg/dL   Calcium 9.5 8.9 - 10.3 mg/dL   GFR, Estimated >60 >60 mL/min    Comment: (NOTE) Calculated using the CKD-EPI Creatinine Equation  (2021)    Anion gap 9 5 - 15    Comment: Performed at [facility], [place] [address] [place], [place] [address]  CBC     Status: Abnormal   Collection Time: 06/04/21  5:26 AM  Result Value Ref Range   WBC 11.9 (H) 4.0 - 10.5 K/uL   RBC 4.25 3.87 - 5.11 MIL/uL   Hemoglobin 13.0 12.0 - 15.0 g/dL   HCT 39.7 36.0 - 46.0 %   MCV 93.4 80.0 - 100.0 fL   MCH 30.6 26.0 - 34.0 pg   MCHC 32.7 30.0 - 36.0 g/dL   RDW 13.2 11.5 - 15.5 %   Platelets 350 150 - 400 K/uL   nRBC 0.0 0.0 - 0.2 %    Comment: Performed at [facility], [address] [place], [place] [address]  Basic metabolic panel     Status: None   Collection Time: 06/04/21  5:26 AM  Result Value Ref Range   Sodium 136 135 - 145 mmol/L   Potassium 3.8 3.5 - 5.1 mmol/L   Chloride 101 98 - 111 mmol/L   CO2 29 22 - 32 mmol/L   Glucose, Bld 95 70 - 99 mg/dL    Comment: Glucose reference range applies only to samples taken after fasting for at least 8 hours.   BUN 11 6 - 20 mg/dL   Creatinine, Ser 0.86 0.44 - 1.00 mg/dL   Calcium 9.1 8.9 - 10.3 mg/dL   GFR, Estimated >60 >60 mL/min    Comment: (NOTE) Calculated using the CKD-EPI Creatinine Equation (2021)    Anion gap 6 5 - 15    Comment: Performed at [facility], [place] [address] [place], [place] [address]    Imaging / Studies: No results found.  Medications / Allergies: per chart  Antibiotics: Anti-infectives (From admission, onward)    Start     Dose/Rate Route Frequency Ordered Stop   06/02/21 0600  cefoTEtan (CEFOTAN) 2 g in sodium chloride 0.9 % 100 mL IVPB        2 g 200 mL/hr over 30 Minutes Intravenous On call to O.R. 06/01/21 9509 06/02/21 0748         Note: Portions of this report may have been transcribed using voice recognition software. Every effort was made to ensure accuracy; however, inadvertent computerized transcription errors may be present.   Any transcriptional errors that result from this  process are unintentional.    Adin Hector, MD, FACS, MASCRS Esophageal, Gastrointestinal & Colorectal Surgery Robotic and Minimally Invasive Surgery  [facility] [address] [place], [place] [address] [phone] Fax [phone] Main/Paging  CONTACT INFORMATION: Weekday (9AM-5PM) concerns: Call [facility] main office at [phone] Weeknight (5PM-9AM) or Weekend/Holiday concerns: Check [email or web address] for General Surgery [facility] coverage (Please, do not use SecureChat as it is not reliable communication to operating surgeons for immediate patient care)      06/04/2021  6:39 AM

## 2021-06-05 ENCOUNTER — Emergency Department (HOSPITAL_COMMUNITY): Payer: BC Managed Care – PPO

## 2021-06-05 ENCOUNTER — Emergency Department (HOSPITAL_COMMUNITY)
Admission: EM | Admit: 2021-06-05 | Discharge: 2021-06-06 | Disposition: A | Discharge status: Home / Self Care | Payer: BC Managed Care – PPO | Attending: Emergency Medicine | Admitting: Emergency Medicine

## 2021-06-05 ENCOUNTER — Other Ambulatory Visit: Payer: Self-pay

## 2021-06-05 DIAGNOSIS — R0600 Dyspnea, unspecified: Secondary | ICD-10-CM | POA: Insufficient documentation

## 2021-06-05 DIAGNOSIS — Z85038 Personal history of other malignant neoplasm of large intestine: Secondary | ICD-10-CM | POA: Insufficient documentation

## 2021-06-05 DIAGNOSIS — D75839 Thrombocytosis, unspecified: Secondary | ICD-10-CM | POA: Insufficient documentation

## 2021-06-05 DIAGNOSIS — R531 Weakness: Secondary | ICD-10-CM | POA: Insufficient documentation

## 2021-06-05 DIAGNOSIS — R42 Dizziness and giddiness: Secondary | ICD-10-CM | POA: Insufficient documentation

## 2021-06-05 DIAGNOSIS — E039 Hypothyroidism, unspecified: Secondary | ICD-10-CM | POA: Insufficient documentation

## 2021-06-05 LAB — CBC WITH DIFFERENTIAL/PLATELET
Abs Immature Granulocytes: 0.04 10*3/uL (ref 0.00–0.07)
Basophils Absolute: 0 10*3/uL (ref 0.0–0.1)
Basophils Relative: 0 %
Eosinophils Absolute: 0.1 10*3/uL (ref 0.0–0.5)
Eosinophils Relative: 1 %
HCT: 39.5 % (ref 36.0–46.0)
Hemoglobin: 13 g/dL (ref 12.0–15.0)
Immature Granulocytes: 0 %
Lymphocytes Relative: 11 %
Lymphs Abs: 1.3 10*3/uL (ref 0.7–4.0)
MCH: 30.3 pg (ref 26.0–34.0)
MCHC: 32.9 g/dL (ref 30.0–36.0)
MCV: 92.1 fL (ref 80.0–100.0)
Monocytes Absolute: 0.5 10*3/uL (ref 0.1–1.0)
Monocytes Relative: 4 %
Neutro Abs: 9.6 10*3/uL — ABNORMAL HIGH (ref 1.7–7.7)
Neutrophils Relative %: 84 %
Platelets: 440 10*3/uL — ABNORMAL HIGH (ref 150–400)
RBC: 4.29 MIL/uL (ref 3.87–5.11)
RDW: 12.7 % (ref 11.5–15.5)
WBC: 11.6 10*3/uL — ABNORMAL HIGH (ref 4.0–10.5)
nRBC: 0 % (ref 0.0–0.2)

## 2021-06-05 LAB — COMPREHENSIVE METABOLIC PANEL
ALT: 46 U/L — ABNORMAL HIGH (ref 0–44)
AST: 51 U/L — ABNORMAL HIGH (ref 15–41)
Albumin: 4.1 g/dL (ref 3.5–5.0)
Alkaline Phosphatase: 67 U/L (ref 38–126)
Anion gap: 9 (ref 5–15)
BUN: 14 mg/dL (ref 6–20)
CO2: 28 mmol/L (ref 22–32)
Calcium: 9.8 mg/dL (ref 8.9–10.3)
Chloride: 101 mmol/L (ref 98–111)
Creatinine, Ser: 0.76 mg/dL (ref 0.44–1.00)
GFR, Estimated: >60 mL/min (ref >60)
Glucose, Bld: 154 mg/dL — ABNORMAL HIGH (ref 70–99)
Potassium: 4.1 mmol/L (ref 3.5–5.1)
Sodium: 138 mmol/L (ref 135–145)
Total Bilirubin: 0.6 mg/dL (ref 0.3–1.2)
Total Protein: 7.8 g/dL (ref 6.5–8.1)

## 2021-06-05 LAB — CBC
HCT: 40.4 % (ref 36.0–46.0)
Hemoglobin: 13.1 g/dL (ref 12.0–15.0)
MCH: 30.1 pg (ref 26.0–34.0)
MCHC: 32.4 g/dL (ref 30.0–36.0)
MCV: 92.9 fL (ref 80.0–100.0)
Platelets: 369 10*3/uL (ref 150–400)
RBC: 4.35 MIL/uL (ref 3.87–5.11)
RDW: 12.8 % (ref 11.5–15.5)
WBC: 8 10*3/uL (ref 4.0–10.5)
nRBC: 0 % (ref 0.0–0.2)

## 2021-06-05 LAB — BASIC METABOLIC PANEL
Anion gap: 5 (ref 5–15)
BUN: 12 mg/dL (ref 6–20)
CO2: 29 mmol/L (ref 22–32)
Calcium: 9.2 mg/dL (ref 8.9–10.3)
Chloride: 104 mmol/L (ref 98–111)
Creatinine, Ser: 0.82 mg/dL (ref 0.44–1.00)
GFR, Estimated: >60 mL/min (ref >60)
Glucose, Bld: 101 mg/dL — ABNORMAL HIGH (ref 70–99)
Potassium: 3.8 mmol/L (ref 3.5–5.1)
Sodium: 138 mmol/L (ref 135–145)

## 2021-06-05 LAB — LIPASE, BLOOD: Lipase: 36 U/L (ref 11–51)

## 2021-06-05 MED ORDER — IOHEXOL 300 MG/ML  SOLN
100.0000 mL | Freq: Once | INTRAMUSCULAR | Status: AC | PRN
  Administered 2021-06-05: 100 mL via INTRAVENOUS

## 2021-06-05 MED ORDER — LACTATED RINGERS IV BOLUS
1000.0000 mL | Freq: Once | INTRAVENOUS | Status: AC
  Administered 2021-06-06: 1000 mL via INTRAVENOUS

## 2021-06-05 MED ORDER — SODIUM CHLORIDE (PF) 0.9 % IJ SOLN
INTRAMUSCULAR | Status: AC
  Filled 2021-06-05: qty 50

## 2021-06-05 MED ORDER — TRAMADOL HCL 50 MG PO TABS: 50.0000 mg | ORAL_TABLET | Freq: Four times a day (QID) | ORAL | 0 refills | Status: AC | PRN

## 2021-06-05 NOTE — ED Provider Notes (Signed)
[facility] DEPT Provider Note   CSN: [id number] Arrival date & time: 06/05/21  2119     History Chief Complaint  Patient presents with   Weakness    Gabrielle Robertson is a 50 y.o. female.  The history is provided by the patient.  Weakness She has history of of hyperlipidemia, colon cancer and is status post recent sigmoid resection and comes in because of weakness and lightheadedness which started about 7 PM.  She had her surgery on 6/23, and had been discharged earlier today.  She states she felt okay when she was discharged but started feeling very weak and lightheaded at about 7 PM.  She denies fever, chills, sweats.  She denies nausea or vomiting.  She denies any urinary difficulty.  She has noted some slight dyspnea but denies any chest pain.   Past Medical History:  Diagnosis Date   Cancer [facility])    Hemorrhoids     Patient Active Problem List   Diagnosis Date Noted   Colon cancer ([place]) 06/02/2021   Rectal bleeding 04/04/2021   HYPOTHYROIDISM 03/01/2010   HYPERLIPIDEMIA 03/01/2010    Past Surgical History:  Procedure Laterality Date   NO PAST SURGERIES       OB History   No obstetric history on file.     Family History  Problem Relation Age of Onset   Colon polyps Neg Hx    Colon cancer Neg Hx    Esophageal cancer Neg Hx    Rectal cancer Neg Hx    Stomach cancer Neg Hx     Social History   Tobacco Use   Smoking status: Never   Smokeless tobacco: Never  Vaping Use   Vaping Use: Never used  Substance Use Topics   Alcohol use: No   Drug use: No    Home Medications Prior to Admission medications   Medication Sig Start Date End Date Taking? Authorizing Provider  Ferrous Sulfate (IRON) 142 (45 Fe) MG TBCR Take 45 mg by mouth daily.    [provider]  Pseudoephedrine-APAP-DM (DAYQUIL PO) Take 1 Dose by mouth daily as needed (congestion).    [provider]  Tetrahydrozoline HCl (REDNESS RELIEVER EYE DROPS  OP) Place 1 drop into both eyes daily as needed (redness).    [provider]  traMADol (ULTRAM) 50 MG tablet Take 1-2 tablets (50-100 mg total) by mouth every 6 (six) hours as needed for moderate pain or severe pain. 06/05/21   Michael Boston, MD    Allergies    Patient has no known allergies.  Review of Systems   Review of Systems  Neurological:  Positive for weakness.  All other systems reviewed and are negative.  Physical Exam Updated Vital Signs BP 113/83 (BP Location: Left Arm)   Pulse 78   Temp (!) 97.5 F (36.4 C) (Oral)   Resp 14   Ht 5' (1.524 m)   Wt 54.4 kg   LMP 11/27/2012   SpO2 98%   BMI 23.44 kg/m   Physical Exam Vitals and nursing note reviewed.  50 year old female, resting comfortably and in no acute distress. Vital signs are normal. Oxygen saturation is 98%, which is normal. Head is normocephalic and atraumatic. PERRLA, EOMI. Oropharynx is clear. Neck is nontender and supple without adenopathy or JVD. Back is nontender and there is no CVA tenderness. Lungs are clear without rales, wheezes, or rhonchi. Chest is nontender. Heart has regular rate and rhythm without murmur. Abdomen is soft, flat, with  moderate tenderness diffusely.  There is no rebound or guarding.  There are no masses or hepatosplenomegaly and peristalsis is normoactive. Extremities have no cyanosis or edema, full range of motion is present. Skin is warm and dry without rash. Neurologic: Mental status is normal, cranial nerves are intact, there are no motor or sensory deficits.  ED Results / Procedures / Treatments   Labs (all labs ordered are listed, but only abnormal results are displayed) Labs Reviewed  COMPREHENSIVE METABOLIC PANEL - Abnormal; Notable for the following components:      Result Value   Glucose, Bld 154 (*)    AST 51 (*)    ALT 46 (*)    All other components within normal limits  CBC WITH DIFFERENTIAL/PLATELET - Abnormal; Notable for the following components:    WBC 11.6 (*)    Platelets 440 (*)    Neutro Abs 9.6 (*)    All other components within normal limits  URINALYSIS, ROUTINE W REFLEX MICROSCOPIC - Abnormal; Notable for the following components:   Color, Urine STRAW (*)    Specific Gravity, Urine >1.046 (*)    All other components within normal limits  D-DIMER, QUANTITATIVE - Abnormal; Notable for the following components:   D-Dimer, Quant 0.67 (*)    All other components within normal limits  LIPASE, BLOOD    EKG EKG Interpretation  Date/Time:  Sunday June 05 2021 22:57:49 EDT Ventricular Rate:  78 PR Interval:  161 QRS Duration: 98 QT Interval:  391 QTC Calculation: 446 R Axis:   64 Text Interpretation: Sinus rhythm RSR' in V1 or V2, right VCD or RVH Otherwise within normal limits No old tracing to compare Confirmed by Delora Fuel ([id number]) on 06/05/2021 11:23:49 PM  Radiology DG Chest 2 View  Result Date: 06/06/2021 CLINICAL DATA:  Weakness EXAM: CHEST - 2 VIEW COMPARISON:  None. FINDINGS: Heart and mediastinal contours are within normal limits. No focal opacities or effusions. No acute bony abnormality. IMPRESSION: No active cardiopulmonary disease. Electronically Signed   By: Rolm Baptise M.D.   On: 06/06/2021 01:00   CT Angio Chest PE W and/or Wo Contrast  Result Date: 06/06/2021 CLINICAL DATA:  Pulmonary embolism, elevated D-dimer, recent colonic resection EXAM: CT ANGIOGRAPHY CHEST WITH CONTRAST TECHNIQUE: Multidetector CT imaging of the chest was performed using the standard protocol during bolus administration of intravenous contrast. Multiplanar CT image reconstructions and MIPs were obtained to evaluate the vascular anatomy. CONTRAST:  38mL OMNIPAQUE IOHEXOL 350 MG/ML SOLN COMPARISON:  04/22/2021 FINDINGS: Cardiovascular: Satisfactory opacification of the pulmonary arteries to the segmental level. No evidence of pulmonary embolism. Normal heart size. No pericardial effusion. Mediastinum/Nodes: Visualized thyroid is  unremarkable. No pathologic thoracic adenopathy. 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 The esophagus is unremarkable. Lungs/Pleura: Mild bibasilar atelectasis. Lungs otherwise clear. No pneumothorax or pleural effusion. Central airways are widely patent. Upper Abdomen: Mild free intraperitoneal gas noted within the upper abdomen and gas within the pericardial fat appears similar to prior examination and is compatible with the given history of recent intra-abdominal surgery. No acute abnormality within the upper abdomen. Musculoskeletal: No acute bone abnormality. No lytic or blastic bone lesion. Review of the MIP images confirms the above findings. IMPRESSION: No pulmonary embolism.  No acute intrathoracic pathology identified. 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 Stable mild free intraperitoneal gas within the visualized upper abdomen in keeping with history of recent colonic resection. Electronically Signed   By:  Fidela Salisbury MD   On: 06/06/2021 03:45   CT ABDOMEN PELVIS W CONTRAST  Result Date: 06/05/2021 CLINICAL DATA:  Recent surgery for colon cancer, presenting with weakness. EXAM: CT ABDOMEN AND PELVIS WITH CONTRAST TECHNIQUE: Multidetector CT imaging of the abdomen and pelvis was performed using the standard protocol following bolus administration of intravenous contrast. CONTRAST:  111mL OMNIPAQUE IOHEXOL 300 MG/ML  SOLN COMPARISON:  Apr 22, 2021 FINDINGS: Lower chest: A stable pericardial versus bronchogenic cyst is seen along the posterior right cardiophrenic angle. Hepatobiliary: No focal liver abnormality is seen. No gallstones, gallbladder wall thickening, or biliary dilatation. Pancreas: Unremarkable. No pancreatic ductal dilatation or surrounding inflammatory changes. Spleen: Normal in size without focal abnormality. Adrenals/Urinary Tract: Adrenal glands are unremarkable. Kidneys are normal  in size, without renal calculi or hydronephrosis. A 1.2 cm diameter simple cyst is seen within the medial aspect of the upper pole of the left kidney. A mild amount of air is seen within the lumen of an otherwise normal appearing urinary bladder. Stomach/Bowel: Stomach is within normal limits. Appendix appears normal. Surgically anastomosed bowel is seen within the region of the mid sigmoid colon. No evidence of bowel wall thickening, distention, or inflammatory changes. Vascular/Lymphatic: Aortic atherosclerosis. No enlarged abdominal or pelvic lymph nodes. Reproductive: Uterus and bilateral adnexa are unremarkable. Other: A moderate amount of subcutaneous air is seen within the anterior abdominal wall, at the level of the kidneys. A 1.3 cm x 1.0 cm area of increased attenuation is also seen within this region, along the midline (axial CT image 37, CT series 2). A mild amount of intramuscular air is also seen along the anterior abdominal and pelvic walls A mild-to-moderate amount of free air is seen within the upper abdomen, with a mild amount of free air seen within the lower pelvis on the left, near the left inguinal region. Musculoskeletal: No acute or significant osseous findings. IMPRESSION: 1. Intra-abdominal free air and postoperative changes within the sigmoid colon consistent with the patient's history of recent colon cancer resection. 2. Air within the subcutaneous fat and musculature of the anterior abdominal and pelvic walls which is also likely postoperative in origin. A small hematoma within the subcutaneous fat of the anterior abdominal wall cannot be excluded. 3. Air within the urinary bladder likely secondary to recent Foley catheterization. Electronically Signed   By: Virgina Norfolk M.D.   On: 06/05/2021 23:55    Procedures Procedures   Medications Ordered in ED Medications  lactated ringers bolus 1,000 mL (has no administration in time range)  iohexol (OMNIPAQUE) 300 MG/ML solution 100  mL (100 mLs Intravenous Contrast Given 06/05/21 2322)  sodium chloride (PF) 0.9 % injection (  Given by Other 06/05/21 2333)    ED Course  I have reviewed the triage vital signs and the nursing notes.  Pertinent labs & imaging results that were available during my care of the patient were reviewed by me and considered in my medical decision making (see chart for details).   MDM Rules/Calculators/A&P                         Weakness, dyspnea, dizziness of uncertain cause.  She is in the early postoperative phase, need to consider development of pneumonia, pulmonary embolism.  She has been sent for CT of abdomen and pelvis.  Initial labs show minimal elevation of transaminases which is not felt to be clinically significant.  ECG is unremarkable.  Will check chest x-ray and D-dimer.  She will also be given IV fluids.  Old records reviewed confirming recent hospitalization for sigmoid resection for colon cancer.  Chest x-ray shows no evidence of pneumonia.  D-dimer is slightly elevated and she is sent for CT angiogram of the chest.  CT angiogram of the chest shows no evidence of pulmonary embolism.  Patient is feeling better after IV fluids.  Orthostatic vital signs did not show significant change in pulse or blood pressure.  She is discharged with instructions to follow-up with PCP.  Encouraged to maintain adequate fluid intake.  Final Clinical Impression(s) / ED Diagnoses Final diagnoses:  Dizziness  Thrombocytosis    Rx / DC Orders ED Discharge Orders     None        Delora Fuel, MD 70/14/10 [phone]

## 2021-06-05 NOTE — ED Triage Notes (Signed)
Pt came in with c/o weakness. Pt had surgery on Thursday for colon cancer. States approx one hour ago she started to have weakness. Vitals stable

## 2021-06-05 NOTE — Progress Notes (Signed)
Nurse reviewed discharge instructions with pt.  Pt verbalized understanding of discharge instructions, follow up appointments and new medications.  Honeycomb dressing removed per order prior to discharge.

## 2021-06-05 NOTE — Discharge Summary (Signed)
Physician Discharge Summary    Patient ID: Gabrielle Robertson MRN: [id number] DOB/AGE: 1971/08/06  50 y.o.  Patient Care Team: System, Provider Not In as PCP - General  Admit date: 06/02/2021  Discharge date: 06/05/2021  Hospital Stay = 3 days    Discharge Diagnoses:  Active Problems:   Colon cancer ([place])   3 Days Post-Op  06/02/2021  POST-OPERATIVE DIAGNOSIS:   colon cancer  SURGERY:  06/02/2021  Procedure(s): XI ROBOT ASSISTED LAPAROSCOPIC SIGMOIDECTOMY  SURGEON:    Surgeon(s): Leighton Ruff, MD Michael Boston, MD  Consults: None  Hospital Course:   The patient underwent the surgery above.  Postoperatively, the patient gradually mobilized and advanced to a solid diet.  Pain and other symptoms were treated aggressively.    By the time of discharge, the patient was walking well the hallways, eating food, having flatus.  Pain was well-controlled on an oral medications.  Based on meeting discharge criteria and continuing to recover, I felt it was safe for the patient to be discharged from the hospital to further recover with close followup. Postoperative recommendations were discussed in detail.  They are written as well.  Discharged Condition: good  Discharge Exam: Blood pressure 115/72, pulse 62, temperature 98.2 F (36.8 C), temperature source Oral, resp. rate 16, height 5' (1.524 m), weight 55 kg, last menstrual period 11/27/2012, SpO2 97 %.  General: Pt awake/alert/oriented x4 in No acute distress Eyes: PERRL, normal EOM.  Sclera clear.  No icterus Neuro: CN II-XII intact w/o focal sensory/motor deficits. Lymph: No head/neck/groin lymphadenopathy Psych:  No delerium/psychosis/paranoia HENT: Normocephalic, Mucus membranes moist.  No thrush Neck: Supple, No tracheal deviation Chest: No chest wall pain w good excursion CV:  Pulses intact.  Regular rhythm MS: Normal AROM mjr joints.  No obvious deformity Abdomen: Soft.  Nondistended.  Nontender.  No evidence of  peritonitis.  No incarcerated hernias. Ext:  SCDs BLE.  No mjr edema.  No cyanosis Skin: No petechiae / purpura   Disposition:    Follow-up Information     Leighton Ruff, MD. Schedule an appointment as soon as possible for a visit in 2 week(s).   Specialties: General Surgery, Colon and Rectal Surgery Contact information: [address] [place] [address] [phone]                 Discharge disposition: 01-Home or Self Care       Discharge Instructions     Call MD for:   Complete by: As directed    FEVER > 101.5 F  (temperatures < 101.5 F are not significant)   Call MD for:  extreme fatigue   Complete by: As directed    Call MD for:  persistant dizziness or light-headedness   Complete by: As directed    Call MD for:  persistant nausea and vomiting   Complete by: As directed    Call MD for:  redness, tenderness, or signs of infection (pain, swelling, redness, odor or green/yellow discharge around incision site)   Complete by: As directed    Call MD for:  severe uncontrolled pain   Complete by: As directed    Diet - low sodium heart healthy   Complete by: As directed    Start with a bland diet such as soups, liquids, starchy foods, low fat foods, etc. the first few days at home. Gradually advance to a solid, low-fat, high fiber diet by the end of the first week at home.   Add a fiber supplement  to your diet (Metamucil, etc) If you feel full, bloated, or constipated, stay on a full liquid or pureed/blenderized diet for a few days until you feel better and are no longer constipated.   Discharge instructions   Complete by: As directed    See Discharge Instructions If you are not getting better after two weeks or are noticing you are getting worse, contact our office [phone] for further advice.  We may need to adjust your medications, re-evaluate you in the office, send you to the emergency room, or see what other things we can do to  help. The clinic staff is available to answer your questions during regular business hours (8:30am-5pm).  Please don't hesitate to call and ask to speak to one of our nurses for clinical concerns.    A surgeon from [facility] is always on call at the hospitals 24 hours/day If you have a medical emergency, go to the nearest emergency room or call 911.   Discharge wound care:   Complete by: As directed    It is good for closed incisions and even open wounds to be washed every day.  Shower every day.  Short baths are fine.  Wash the incisions and wounds clean with soap & water.    You may leave closed incisions open to air if it is dry.   You may cover the incision with clean gauze & replace it after your daily shower for comfort.  TEGADERM:  You have clear gauze band-aid dressings over your closed incision(s).  Remove the dressings 3 days after surgery = Sunday 6/26   Driving Restrictions   Complete by: As directed    You may drive when: - you are no longer taking narcotic prescription pain medication - you can comfortably wear a seatbelt - you can safely make sudden turns/stops without pain.   Increase activity slowly   Complete by: As directed    Start light daily activities --- self-care, walking, climbing stairs- beginning the day after surgery.  Gradually increase activities as tolerated.  Control your pain to be active.  Stop when you are tired.  Ideally, walk several times a day, eventually an hour a day.   Most people are back to most day-to-day activities in a few weeks.  It takes 4-6 weeks to get back to unrestricted, intense activity. If you can walk 30 minutes without difficulty, it is safe to try more intense activity such as jogging, treadmill, bicycling, low-impact aerobics, swimming, etc. Save the most intensive and strenuous activity for last (Usually 4-8 weeks after surgery) such as sit-ups, heavy lifting, contact sports, etc.  Refrain from any intense heavy lifting  or straining until you are off narcotics for pain control.  You will have off days, but things should improve week-by-week. DO NOT PUSH THROUGH PAIN.  Let pain be your guide: If it hurts to do something, don't do it.   Lifting restrictions   Complete by: As directed    If you can walk 30 minutes without difficulty, it is safe to try more intense activity such as jogging, treadmill, bicycling, low-impact aerobics, swimming, etc. Save the most intensive and strenuous activity for last (Usually 4-8 weeks after surgery) such as sit-ups, heavy lifting, contact sports, etc.   Refrain from any intense heavy lifting or straining until you are off narcotics for pain control.  You will have off days, but things should improve week-by-week. DO NOT PUSH THROUGH PAIN.  Let pain be your guide: If  it hurts to do something, don't do it.  Pain is your body warning you to avoid that activity for another week until the pain goes down.   May shower / Bathe   Complete by: As directed    May walk up steps   Complete by: As directed    Remove dressing in 72 hours   Complete by: As directed    Make sure all dressings are removed by the third day after surgery.  Leave incisions open to air.  OK to cover incisions with gauze or bandages as desired   Sexual Activity Restrictions   Complete by: As directed    You may have sexual intercourse when it is comfortable. If it hurts to do something, stop.       Allergies as of 06/05/2021   No Known Allergies      Medication List     TAKE these medications    DAYQUIL PO Take 1 Dose by mouth daily as needed (congestion).   Iron 142 (45 Fe) MG Tbcr Take 45 mg by mouth daily.   REDNESS RELIEVER EYE DROPS OP Place 1 drop into both eyes daily as needed (redness).   traMADol 50 MG tablet Commonly known as: ULTRAM Take 1-2 tablets (50-100 mg total) by mouth every 6 (six) hours as needed for moderate pain or severe pain.               Discharge Care  Instructions  (From admission, onward)           Start     Ordered   06/05/21 0000  Discharge wound care:       Comments: It is good for closed incisions and even open wounds to be washed every day.  Shower every day.  Short baths are fine.  Wash the incisions and wounds clean with soap & water.    You may leave closed incisions open to air if it is dry.   You may cover the incision with clean gauze & replace it after your daily shower for comfort.  TEGADERM:  You have clear gauze band-aid dressings over your closed incision(s).  Remove the dressings 3 days after surgery = Sunday 6/26   06/05/21 0953            Significant Diagnostic Studies:  Results for orders placed or performed during the hospital encounter of 06/02/21 (from the past 72 hour(s))  CBC     Status: Abnormal   Collection Time: 06/03/21  4:14 AM  Result Value Ref Range   WBC 15.8 (H) 4.0 - 10.5 K/uL   RBC 4.26 3.87 - 5.11 MIL/uL   Hemoglobin 12.7 12.0 - 15.0 g/dL   HCT 39.0 36.0 - 46.0 %   MCV 91.5 80.0 - 100.0 fL   MCH 29.8 26.0 - 34.0 pg   MCHC 32.6 30.0 - 36.0 g/dL   RDW 12.9 11.5 - 15.5 %   Platelets 355 150 - 400 K/uL   nRBC 0.0 0.0 - 0.2 %    Comment: Performed at [facility], [place] [address], [place], [place] [address]  Basic metabolic panel     Status: Abnormal   Collection Time: 06/03/21  4:14 AM  Result Value Ref Range   Sodium 141 135 - 145 mmol/L   Potassium 4.1 3.5 - 5.1 mmol/L   Chloride 105 98 - 111 mmol/L   CO2 27 22 - 32 mmol/L   Glucose, Bld 110 (H) 70 - 99 mg/dL  Comment: Glucose reference range applies only to samples taken after fasting for at least 8 hours.   BUN 9 6 - 20 mg/dL   Creatinine, Ser 0.87 0.44 - 1.00 mg/dL   Calcium 9.5 8.9 - 10.3 mg/dL   GFR, Estimated >60 >60 mL/min    Comment: (NOTE) Calculated using the CKD-EPI Creatinine Equation (2021)    Anion gap 9 5 - 15    Comment: Performed at [facility], [place] [address]  [place], [place] [address]  CBC     Status: Abnormal   Collection Time: 06/04/21  5:26 AM  Result Value Ref Range   WBC 11.9 (H) 4.0 - 10.5 K/uL   RBC 4.25 3.87 - 5.11 MIL/uL   Hemoglobin 13.0 12.0 - 15.0 g/dL   HCT 39.7 36.0 - 46.0 %   MCV 93.4 80.0 - 100.0 fL   MCH 30.6 26.0 - 34.0 pg   MCHC 32.7 30.0 - 36.0 g/dL   RDW 13.2 11.5 - 15.5 %   Platelets 350 150 - 400 K/uL   nRBC 0.0 0.0 - 0.2 %    Comment: Performed at [facility], [place] [address] [place], [place] [address]  Basic metabolic panel     Status: None   Collection Time: 06/04/21  5:26 AM  Result Value Ref Range   Sodium 136 135 - 145 mmol/L   Potassium 3.8 3.5 - 5.1 mmol/L   Chloride 101 98 - 111 mmol/L   CO2 29 22 - 32 mmol/L   Glucose, Bld 95 70 - 99 mg/dL    Comment: Glucose reference range applies only to samples taken after fasting for at least 8 hours.   BUN 11 6 - 20 mg/dL   Creatinine, Ser 0.86 0.44 - 1.00 mg/dL   Calcium 9.1 8.9 - 10.3 mg/dL   GFR, Estimated >60 >60 mL/min    Comment: (NOTE) Calculated using the CKD-EPI Creatinine Equation (2021)    Anion gap 6 5 - 15    Comment: Performed at [facility], [place] [address], [place], [place] [address]  CBC     Status: None   Collection Time: 06/05/21  4:19 AM  Result Value Ref Range   WBC 8.0 4.0 - 10.5 K/uL   RBC 4.35 3.87 - 5.11 MIL/uL   Hemoglobin 13.1 12.0 - 15.0 g/dL   HCT 40.4 36.0 - 46.0 %   MCV 92.9 80.0 - 100.0 fL   MCH 30.1 26.0 - 34.0 pg   MCHC 32.4 30.0 - 36.0 g/dL   RDW 12.8 11.5 - 15.5 %   Platelets 369 150 - 400 K/uL   nRBC 0.0 0.0 - 0.2 %    Comment: Performed at [facility], [place] [address], [place], [place] [address]  Basic metabolic panel     Status: Abnormal   Collection Time: 06/05/21  4:19 AM  Result Value Ref Range   Sodium 138 135 - 145 mmol/L   Potassium 3.8 3.5 - 5.1 mmol/L   Chloride 104 98 - 111 mmol/L   CO2 29 22 - 32 mmol/L   Glucose, Bld 101 (H) 70 - 99 mg/dL     Comment: Glucose reference range applies only to samples taken after fasting for at least 8 hours.   BUN 12 6 - 20 mg/dL   Creatinine, Ser 0.82 0.44 - 1.00 mg/dL   Calcium 9.2 8.9 - 10.3 mg/dL   GFR, Estimated >60 >60 mL/min    Comment: (NOTE) Calculated  using the CKD-EPI Creatinine Equation (2021)    Anion gap 5 5 - 15    Comment: Performed at [facility], [place] [address] [place], [place] [address]    No results found.  Past Medical History:  Diagnosis Date   Cancer ([place])    Hemorrhoids     Past Surgical History:  Procedure Laterality Date   NO PAST SURGERIES      Social History   Socioeconomic History   Marital status: Married    Spouse name: Not on file   Number of children: Not on file   Years of education: Not on file   Highest education level: Not on file  Occupational History   Not on file  Tobacco Use   Smoking status: Never   Smokeless tobacco: Never  Vaping Use   Vaping Use: Never used  Substance and Sexual Activity   Alcohol use: No   Drug use: No   Sexual activity: Not Currently  Other Topics Concern   Not on file  Social History Narrative   Not on file   Social Determinants of Health   Financial Resource Strain: Not on file  Food Insecurity: Not on file  Transportation Needs: Not on file  Physical Activity: Not on file  Stress: Not on file  Social Connections: Not on file  Intimate Partner Violence: Not on file    Family History  Problem Relation Age of Onset   Colon polyps Neg Hx    Colon cancer Neg Hx    Esophageal cancer Neg Hx    Rectal cancer Neg Hx    Stomach cancer Neg Hx     Current Facility-Administered Medications  Medication Dose Route Frequency Provider Last Rate Last Admin   acetaminophen (TYLENOL) tablet 1,000 mg  1,000 mg Oral K5L Leighton Ruff, MD   9,767 mg at 06/05/21 0320   alum & mag hydroxide-simeth (MAALOX/MYLANTA) 200-200-20 MG/5ML suspension 30 mL  30 mL Oral H4L PRN Leighton Ruff, MD        alvimopan (ENTEREG) capsule 12 mg  12 mg Oral BID Leighton Ruff, MD   12 mg at 06/04/21 2222   diphenhydrAMINE (BENADRYL) 12.5 MG/5ML elixir 12.5 mg  12.5 mg Oral P3X PRN Leighton Ruff, MD       Or   diphenhydrAMINE (BENADRYL) injection 12.5 mg  12.5 mg Intravenous T0W PRN Leighton Ruff, MD       enoxaparin (LOVENOX) injection 40 mg  40 mg Subcutaneous I09B Leighton Ruff, MD   40 mg at 06/05/21 0839   feeding supplement (ENSURE SURGERY) liquid 237 mL  237 mL Oral BID BM Leighton Ruff, MD   353 mL at 06/04/21 1058   gabapentin (NEURONTIN) capsule 100 mg  100 mg Oral BID Leighton Ruff, MD   299 mg at 06/04/21 2222   HYDROmorphone (DILAUDID) injection 0.5 mg  0.5 mg Intravenous M4Q PRN Leighton Ruff, MD       lactated ringers bolus 1,000 mL  1,000 mL Intravenous Q8H PRN Orlando Devereux, Remo Lipps, MD       lip balm (CARMEX) ointment 1 application  1 application Topical BID Michael Boston, MD   1 application at 68/34/19 2225   magic mouthwash  15 mL Oral QID PRN Michael Boston, MD       methocarbamol (ROBAXIN) 1,000 mg in dextrose 5 % 100 mL IVPB  1,000 mg Intravenous Q6H PRN Michael Boston, MD       methocarbamol (ROBAXIN) tablet 1,000 mg  1,000 mg Oral Q6H  PRN Michael Boston, MD       ondansetron [facility]) tablet 4 mg  4 mg Oral D4C PRN Leighton Ruff, MD       Or   ondansetron [facility]) injection 4 mg  4 mg Intravenous C6F PRN Leighton Ruff, MD   4 mg at 90/12/22 1352   polycarbophil (FIBERCON) tablet 625 mg  625 mg Oral BID Michael Boston, MD   625 mg at 06/04/21 2221   saccharomyces boulardii (FLORASTOR) capsule 250 mg  250 mg Oral BID Leighton Ruff, MD   411 mg at 06/04/21 2222   simethicone (MYLICON) chewable tablet 40 mg  40 mg Oral O6W PRN Leighton Ruff, MD       traMADol Veat[facility] Bourbon) tablet 50-100 mg  50-100 mg Oral V1U PRN Leighton Ruff, MD   50 mg at 06/03/21 1045     No Known Allergies  Signed: Morton Peters, MD, FACS, MASCRS Esophageal, Gastrointestinal & Colorectal  Surgery Robotic and Minimally Invasive Surgery  [facility] [address], [place], [place] [address] [phone] Fax [phone] Main/Paging  CONTACT INFORMATION: Weekday (9AM-5PM) concerns: Call [facility] main office at [phone] Weeknight (5PM-9AM) or Weekend/Holiday concerns: Check [email or web address] for General Surgery [facility] coverage (Please, do not use SecureChat as it is not reliable communication to operating surgeons for immediate patient care)      06/05/2021, 9:54 AM

## 2021-06-05 NOTE — ED Provider Notes (Signed)
Emergency Medicine Provider Triage Evaluation Note  Gabrielle Robertson , a 50 y.o. female  was evaluated in triage.  Pt complains of generalized weakness beginning today. Sigmoidectomy on 6/23.  Review of Systems  Positive: Generalized weakness, nausea, lightheadedness Negative: Vomiting, syncope, increased abdominal pain  Physical Exam  BP 124/86 (BP Location: Right Arm)   Pulse 75   Temp 97.8 F (36.6 C) (Oral)   Resp 18   Ht 5' (1.524 m)   Wt 54.4 kg   LMP 11/27/2012   SpO2 97%   BMI 23.44 kg/m  Gen:   Awake, no distress   Resp:  Normal effort  MSK:   Moves extremities without difficulty  Other:  Generally tender in the abdomen.   Medical Decision Making  Medically screening exam initiated at 10:35 PM.  Appropriate orders placed.  Gabrielle Robertson was informed that the remainder of the evaluation will be completed by another provider, this initial triage assessment does not replace that evaluation, and the importance of remaining in the ED until their evaluation is complete.     Lorayne Bender, PA-C 06/05/21 2238    Hayden Rasmussen, MD 06/06/21 [phone]

## 2021-06-06 ENCOUNTER — Other Ambulatory Visit (HOSPITAL_COMMUNITY): Payer: Self-pay

## 2021-06-06 ENCOUNTER — Emergency Department (HOSPITAL_COMMUNITY): Payer: BC Managed Care – PPO

## 2021-06-06 LAB — URINALYSIS, ROUTINE W REFLEX MICROSCOPIC
Bilirubin Urine: NEGATIVE
Glucose, UA: NEGATIVE mg/dL
Hgb urine dipstick: NEGATIVE
Ketones, ur: NEGATIVE mg/dL
Leukocytes,Ua: NEGATIVE
Nitrite: NEGATIVE
Protein, ur: NEGATIVE mg/dL
Specific Gravity, Urine: >1.046 — ABNORMAL HIGH (ref 1.005–1.030)
pH: 7 (ref 5.0–8.0)

## 2021-06-06 LAB — SURGICAL PATHOLOGY

## 2021-06-06 LAB — D-DIMER, QUANTITATIVE: D-Dimer, Quant: 0.67 ug/mL-FEU — ABNORMAL HIGH (ref 0.00–0.50)

## 2021-06-06 MED ORDER — IOHEXOL 350 MG/ML SOLN
75.0000 mL | Freq: Once | INTRAVENOUS | Status: AC | PRN
  Administered 2021-06-06: 75 mL via INTRAVENOUS

## 2021-06-06 MED ORDER — SODIUM CHLORIDE (PF) 0.9 % IJ SOLN
INTRAMUSCULAR | Status: AC
  Filled 2021-06-06: qty 50

## 2021-06-06 NOTE — ED Notes (Signed)
New 20G IV placed for CT Angiogram.

## 2021-06-06 NOTE — Discharge Instructions (Addendum)
Drink plenty of fluids.  Return if symptoms are getting worse.

## 2021-06-06 NOTE — ED Notes (Signed)
Patient transported to CT 

## 2021-06-07 ENCOUNTER — Other Ambulatory Visit (HOSPITAL_COMMUNITY): Payer: Self-pay

## 2021-08-03 ENCOUNTER — Emergency Department (HOSPITAL_COMMUNITY)
Admission: EM | Admit: 2021-08-03 | Discharge: 2021-08-03 | Disposition: A | Discharge status: Home / Self Care | Payer: BC Managed Care – PPO | Attending: Emergency Medicine | Admitting: Emergency Medicine

## 2021-08-03 ENCOUNTER — Emergency Department (HOSPITAL_COMMUNITY): Payer: BC Managed Care – PPO

## 2021-08-03 ENCOUNTER — Encounter (HOSPITAL_COMMUNITY): Payer: Self-pay

## 2021-08-03 ENCOUNTER — Other Ambulatory Visit: Payer: Self-pay

## 2021-08-03 DIAGNOSIS — Z20822 Contact with and (suspected) exposure to covid-19: Secondary | ICD-10-CM | POA: Diagnosis not present

## 2021-08-03 DIAGNOSIS — R110 Nausea: Secondary | ICD-10-CM | POA: Diagnosis not present

## 2021-08-03 DIAGNOSIS — R1032 Left lower quadrant pain: Secondary | ICD-10-CM | POA: Diagnosis not present

## 2021-08-03 DIAGNOSIS — Z859 Personal history of malignant neoplasm, unspecified: Secondary | ICD-10-CM | POA: Insufficient documentation

## 2021-08-03 DIAGNOSIS — R519 Headache, unspecified: Secondary | ICD-10-CM | POA: Insufficient documentation

## 2021-08-03 LAB — COMPREHENSIVE METABOLIC PANEL
ALT: 16 U/L (ref 0–44)
AST: 19 U/L (ref 15–41)
Albumin: 4.4 g/dL (ref 3.5–5.0)
Alkaline Phosphatase: 73 U/L (ref 38–126)
Anion gap: 8 (ref 5–15)
BUN: 6 mg/dL (ref 6–20)
CO2: 24 mmol/L (ref 22–32)
Calcium: 9.6 mg/dL (ref 8.9–10.3)
Chloride: 107 mmol/L (ref 98–111)
Creatinine, Ser: 0.86 mg/dL (ref 0.44–1.00)
GFR, Estimated: >60 mL/min (ref >60)
Glucose, Bld: 124 mg/dL — ABNORMAL HIGH (ref 70–99)
Potassium: 3.6 mmol/L (ref 3.5–5.1)
Sodium: 139 mmol/L (ref 135–145)
Total Bilirubin: 1.4 mg/dL — ABNORMAL HIGH (ref 0.3–1.2)
Total Protein: 7.8 g/dL (ref 6.5–8.1)

## 2021-08-03 LAB — CBC WITH DIFFERENTIAL/PLATELET
Abs Immature Granulocytes: 0.05 10*3/uL (ref 0.00–0.07)
Basophils Absolute: 0 10*3/uL (ref 0.0–0.1)
Basophils Relative: 0 %
Eosinophils Absolute: 0.1 10*3/uL (ref 0.0–0.5)
Eosinophils Relative: 1 %
HCT: 38 % (ref 36.0–46.0)
Hemoglobin: 13.2 g/dL (ref 12.0–15.0)
Immature Granulocytes: 0 %
Lymphocytes Relative: 8 %
Lymphs Abs: 1.3 10*3/uL (ref 0.7–4.0)
MCH: 31.1 pg (ref 26.0–34.0)
MCHC: 34.7 g/dL (ref 30.0–36.0)
MCV: 89.4 fL (ref 80.0–100.0)
Monocytes Absolute: 0.8 10*3/uL (ref 0.1–1.0)
Monocytes Relative: 5 %
Neutro Abs: 13.6 10*3/uL — ABNORMAL HIGH (ref 1.7–7.7)
Neutrophils Relative %: 86 %
Platelets: 349 10*3/uL (ref 150–400)
RBC: 4.25 MIL/uL (ref 3.87–5.11)
RDW: 12.9 % (ref 11.5–15.5)
WBC: 15.9 10*3/uL — ABNORMAL HIGH (ref 4.0–10.5)
nRBC: 0 % (ref 0.0–0.2)

## 2021-08-03 LAB — URINALYSIS, ROUTINE W REFLEX MICROSCOPIC
Bacteria, UA: NONE SEEN
Bilirubin Urine: NEGATIVE
Glucose, UA: NEGATIVE mg/dL
Ketones, ur: NEGATIVE mg/dL
Leukocytes,Ua: NEGATIVE
Nitrite: NEGATIVE
Protein, ur: NEGATIVE mg/dL
Specific Gravity, Urine: 1.005 (ref 1.005–1.030)
pH: 6 (ref 5.0–8.0)

## 2021-08-03 LAB — RESP PANEL BY RT-PCR (FLU A&B, COVID) ARPGX2
Influenza A by PCR: NEGATIVE
Influenza B by PCR: NEGATIVE
SARS Coronavirus 2 by RT PCR: NEGATIVE

## 2021-08-03 MED ORDER — SODIUM CHLORIDE 0.9 % IV BOLUS
1000.0000 mL | Freq: Once | INTRAVENOUS | Status: AC
  Administered 2021-08-03: 1000 mL via INTRAVENOUS

## 2021-08-03 MED ORDER — HYDROMORPHONE HCL 1 MG/ML IJ SOLN
0.5000 mg | Freq: Once | INTRAMUSCULAR | Status: AC
Start: 2021-08-03 — End: 2021-08-03
  Administered 2021-08-03: 0.5 mg via INTRAVENOUS
  Filled 2021-08-03: qty 1

## 2021-08-03 MED ORDER — DICYCLOMINE HCL 10 MG PO CAPS
10.0000 mg | ORAL_CAPSULE | Freq: Once | ORAL | Status: AC
  Administered 2021-08-03: 10 mg via ORAL
  Filled 2021-08-03: qty 1

## 2021-08-03 MED ORDER — POLYETHYLENE GLYCOL 3350 17 G PO PACK: 17.0000 g | PACK | Freq: Every day | ORAL | 1 refills | Status: AC

## 2021-08-03 MED ORDER — IOHEXOL 350 MG/ML SOLN
100.0000 mL | Freq: Once | INTRAVENOUS | Status: AC | PRN
  Administered 2021-08-03: 70 mL via INTRAVENOUS

## 2021-08-03 MED ORDER — DICYCLOMINE HCL 20 MG PO TABS: 20.0000 mg | ORAL_TABLET | Freq: Two times a day (BID) | ORAL | 0 refills | Status: AC

## 2021-08-03 NOTE — ED Triage Notes (Signed)
Pt complains of headache, diarrhea, and abdominal pain since 7 pm yesterday.

## 2021-08-03 NOTE — Discharge Instructions (Addendum)
You were evaluated in the Emergency Department and after careful evaluation, we did not find any emergent condition requiring admission or further testing in the hospital.  Your exam/testing today is overall reassuring.  Your CT scan did not show any emergencies.  Recommend taking the MiraLAX medication up to 6 times daily until you achieve frequent soft stools.  Take the Bentyl medication as needed for pain.  Recommend close follow-up with your surgeon, call the office to schedule an appointment.  Please return to the Emergency Department if you experience any worsening of your condition.   Thank you for allowing [place] to be a part of your care.

## 2021-08-03 NOTE — ED Provider Notes (Signed)
[facility] Emergency Department Provider Note MRN:  [id number]  Arrival date & time: 08/03/21     Chief Complaint   Abdominal Pain, Headache, and Diarrhea   History of Present Illness   Gabrielle Robertson is a 50 y.o. year-old female with a history of colon cancer presenting to the ED with chief complaint of abdominal pain.  Location:  LLQ Duration:  8 hours Onset:  sudden Timing:  constant Description:  burning ache Severity:  severe Exacerbating/Alleviating Factors:  none Associated Symptoms:  nausea, left flank pain, headache Pertinent Negatives:  no fever, no n/v/d  Additional History:  recent sigmoidectomy  Review of Systems  A complete 10 system review of systems was obtained and all systems are negative except as noted in the HPI and PMH.   Patient's Health History    Past Medical History:  Diagnosis Date   Cancer [facility])    Hemorrhoids     Past Surgical History:  Procedure Laterality Date   NO PAST SURGERIES      Family History  Problem Relation Age of Onset   Colon polyps Neg Hx    Colon cancer Neg Hx    Esophageal cancer Neg Hx    Rectal cancer Neg Hx    Stomach cancer Neg Hx     Social History   Socioeconomic History   Marital status: Married    Spouse name: Not on file   Number of children: Not on file   Years of education: Not on file   Highest education level: Not on file  Occupational History   Not on file  Tobacco Use   Smoking status: Never   Smokeless tobacco: Never  Vaping Use   Vaping Use: Never used  Substance and Sexual Activity   Alcohol use: No   Drug use: No   Sexual activity: Not Currently  Other Topics Concern   Not on file  Social History Narrative   Not on file   Social Determinants of Health   Financial Resource Strain: Not on file  Food Insecurity: Not on file  Transportation Needs: Not on file  Physical Activity: Not on file  Stress: Not on file  Social Connections: Not on file  Intimate  Partner Violence: Not on file     Physical Exam   Vitals:   08/03/21 0211  BP: 114/80  Pulse: (!) 107  Resp: 18  Temp: 98.6 F (37 C)  SpO2: 100%    CONSTITUTIONAL:  well-appearing, in mild distress due to pain NEURO:  Alert and oriented x 3, no focal deficits EYES:  eyes equal and reactive ENT/NECK:  no LAD, no JVD CARDIO:  tachycardic rate, well-perfused, normal S1 and S2 PULM:  CTAB no wheezing or rhonchi GI/GU:  normal bowel sounds, non-distended, moderate LLQ TTP MSK/SPINE:  No gross deformities, no edema SKIN:  no rash, atraumatic PSYCH:  Appropriate speech and behavior  *Additional and/or pertinent findings included in MDM below  Diagnostic and Interventional Summary    EKG Interpretation  Date/Time:    Ventricular Rate:    PR Interval:    QRS Duration:   QT Interval:    QTC Calculation:   R Axis:     Text Interpretation:         Labs Reviewed  CBC WITH DIFFERENTIAL/PLATELET - Abnormal; Notable for the following components:      Result Value   WBC 15.9 (*)    Neutro Abs 13.6 (*)    All other components within normal limits  COMPREHENSIVE METABOLIC PANEL - Abnormal; Notable for the following components:   Glucose, Bld 124 (*)    Total Bilirubin 1.4 (*)    All other components within normal limits  URINALYSIS, ROUTINE W REFLEX MICROSCOPIC - Abnormal; Notable for the following components:   Color, Urine STRAW (*)    Hgb urine dipstick MODERATE (*)    All other components within normal limits  RESP PANEL BY RT-PCR (FLU A&B, COVID) [id number]  URINE CULTURE    CT ABDOMEN PELVIS W CONTRAST  Final Result    CT HEAD WO CONTRAST (5MM)  Final Result      Medications  dicyclomine (BENTYL) capsule 10 mg (has no administration in time range)  sodium chloride 0.9 % bolus 1,000 mL (0 mLs Intravenous Stopped 08/03/21 0518)  HYDROmorphone (DILAUDID) injection 0.5 mg (0.5 mg Intravenous Given 08/03/21 0339)  iohexol (OMNIPAQUE) 350 MG/ML injection 100 mL (70 mLs  Intravenous Contrast Given 08/03/21 0454)     Procedures  /  Critical Care Procedures  ED Course and Medical Decision Making  I have reviewed the triage vital signs, the nursing notes, and pertinent available records from the EMR.  Listed above are laboratory and imaging tests that I personally ordered, reviewed, and interpreted and then considered in my medical decision making (see below for details).  Concern for post op bleeding or infection, kidney stone will need ct     CT is normal.  Patient is endorsing trouble with bowel movements recently, constipation.  This could be the cause of patient's pain.  She is not having any chest pain or shortness of breath, has no leg pain or swelling, sending urine for culture, no obvious signs of infection.  Overall unclear cause of patient's pain but nothing to suggest emergent process, appropriate for discharge.  Barth Kirks. Sedonia Small, [place] [email or web address]  Final Clinical Impressions(s) / ED Diagnoses     ICD-10-CM   1. Left lower quadrant abdominal pain  R10.32       ED Discharge Orders          Ordered    dicyclomine (BENTYL) 20 MG tablet  2 times daily        08/03/21 0546    polyethylene glycol (MIRALAX) 17 g packet  Daily        08/03/21 0546             Discharge Instructions Discussed with and Provided to Patient:     Discharge Instructions      You were evaluated in the Emergency Department and after careful evaluation, we did not find any emergent condition requiring admission or further testing in the hospital.  Your exam/testing today is overall reassuring.  Your CT scan did not show any emergencies.  Recommend taking the MiraLAX medication up to 6 times daily until you achieve frequent soft stools.  Take the Bentyl medication as needed for pain.  Recommend close follow-up with your surgeon, call the office to schedule an appointment.  Please return to the  Emergency Department if you experience any worsening of your condition.   Thank you for allowing [place] to be a part of your care.        Maudie Flakes, MD 08/03/21 [phone]

## 2021-08-04 LAB — URINE CULTURE: Culture: <10000 — AB

## 2022-04-17 ENCOUNTER — Encounter: Payer: Self-pay | Admitting: Gastroenterology

## 2022-05-03 ENCOUNTER — Ambulatory Visit (AMBULATORY_SURGERY_CENTER): Payer: BC Managed Care – PPO | Admitting: *Deleted

## 2022-05-03 VITALS — Ht 62.0 in | Wt 121.4 lb

## 2022-05-03 DIAGNOSIS — C187 Malignant neoplasm of sigmoid colon: Secondary | ICD-10-CM

## 2022-05-03 MED ORDER — PLENVU 140 G PO SOLR: 1.0000 | ORAL | 0 refills | Status: DC

## 2022-05-03 NOTE — Progress Notes (Signed)
No egg or soy allergy known to patient  No issues known to pt with past sedation with any surgeries or procedures Patient denies ever being told they had issues or difficulty with intubation  No FH of Malignant Hyperthermia Pt is not on diet pills Pt is not on  home 02  Pt is not on blood thinners  Pt denies issues with constipation  No A fib or A flutter  Plenvu Coupon to pt in PV today , Code to Pharmacy and  NO PA's for preps discussed with pt In PV today  Discussed with pt there will be an out-of-pocket cost for prep and that varies from $0 to 70 +  dollars - pt verbalized understanding    PV completed in person with son. Pt speaks [place], son with her to help her if she didn't understand per pt. Pt verified name, DOB, address and insurance during PV today.  Pt given instruction packet to take home after we discussed prep and when/how to complete. Also read over consent and signed after denying questions. Pt encouraged to call with questions or issues.  If pt has My chart, procedure instructions sent via My Chart  Insurance confirmed with pt at [facility] today

## 2022-05-28 ENCOUNTER — Encounter: Payer: Self-pay | Admitting: Certified Registered Nurse Anesthetist

## 2022-05-29 ENCOUNTER — Encounter: Payer: Self-pay | Admitting: Gastroenterology

## 2022-05-31 ENCOUNTER — Ambulatory Visit (AMBULATORY_SURGERY_CENTER): Payer: BC Managed Care – PPO | Admitting: Gastroenterology

## 2022-05-31 ENCOUNTER — Encounter: Payer: Self-pay | Admitting: Gastroenterology

## 2022-05-31 VITALS — BP 130/66 | HR 70 | Temp 97.3°F | Resp 12 | Ht 60.0 in | Wt 121.4 lb

## 2022-05-31 DIAGNOSIS — Z08 Encounter for follow-up examination after completed treatment for malignant neoplasm: Secondary | ICD-10-CM | POA: Diagnosis not present

## 2022-05-31 DIAGNOSIS — Z85038 Personal history of other malignant neoplasm of large intestine: Secondary | ICD-10-CM

## 2022-05-31 MED ORDER — SODIUM CHLORIDE 0.9 % IV SOLN: 500.0000 mL | Freq: Once | INTRAVENOUS | Status: DC

## 2022-05-31 NOTE — Progress Notes (Signed)
Report given to PACU, vss 

## 2022-05-31 NOTE — Patient Instructions (Signed)
YOU HAD AN ENDOSCOPIC PROCEDURE TODAY AT [facility]:   Refer to the procedure report that was given to you for any specific questions about what was found during the examination.  If the procedure report does not answer your questions, please call your gastroenterologist to clarify.  If you requested that your care partner not be given the details of your procedure findings, then the procedure report has been included in a sealed envelope for you to review at your convenience later.  YOU SHOULD EXPECT: Some feelings of bloating in the abdomen. Passage of more gas than usual.  Walking can help get rid of the air that was put into your GI tract during the procedure and reduce the bloating. If you had a lower endoscopy (such as a colonoscopy or flexible sigmoidoscopy) you may notice spotting of blood in your stool or on the toilet paper. If you underwent a bowel prep for your procedure, you may not have a normal bowel movement for a few days.  Please Note:  You might notice some irritation and congestion in your nose or some drainage.  This is from the oxygen used during your procedure.  There is no need for concern and it should clear up in a day or so.  SYMPTOMS TO REPORT IMMEDIATELY:  Following lower endoscopy (colonoscopy or flexible sigmoidoscopy):  Excessive amounts of blood in the stool  Significant tenderness or worsening of abdominal pains  Swelling of the abdomen that is new, acute  Fever of 100F or higher  Following upper endoscopy (EGD)  Vomiting of blood or coffee ground material  New chest pain or pain under the shoulder blades  Painful or persistently difficult swallowing  New shortness of breath  Fever of 100F or higher  Black, tarry-looking stools  For urgent or emergent issues, a gastroenterologist can be reached at any hour by calling [phone]. Do not use MyChart messaging for urgent concerns.    DIET:  We do recommend a small meal at first, but  then you may proceed to your regular diet.  Drink plenty of fluids but you should avoid alcoholic beverages for 24 hours.  ACTIVITY:  You should plan to take it easy for the rest of today and you should NOT DRIVE or use heavy machinery until tomorrow (because of the sedation medicines used during the test).    FOLLOW UP: Our staff will call the number listed on your records 24-72 hours following your procedure to check on you and address any questions or concerns that you may have regarding the information given to you following your procedure. If we do not reach you, we will leave a message.  We will attempt to reach you two times.  During this call, we will ask if you have developed any symptoms of COVID 19. If you develop any symptoms (ie: fever, flu-like symptoms, shortness of breath, cough etc.) before then, please call [phone].  If you test positive for Covid 19 in the 2 weeks post procedure, please call and report this information to [place].    If any biopsies were taken you will be contacted by phone or by letter within the next 1-3 weeks.  Please call us at [phone] if you have not heard about the biopsies in 3 weeks.    SIGNATURES/CONFIDENTIALITY: You and/or your care partner have signed paperwork which will be entered into your electronic medical record.  These signatures attest to the fact that that the information above on your After  Visit Summary has been reviewed and is understood.  Full responsibility of the confidentiality of this discharge information lies with you and/or your care-partner.

## 2022-05-31 NOTE — Op Note (Signed)
[facility] Patient Name: Shawnita Krizek Procedure Date: 05/31/2022 10:10 AM MRN: [id number] Endoscopist: Mallie Mussel L. 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 The patient's tolerance of                            previous anesthesia was also reviewed. The risks                            and benefits of the procedure and the sedation                            options and risks were discussed with the patient.                            All questions were answered, and informed consent                            was obtained. Prior Anticoagulants: The patient has                            taken no previous anticoagulant or antiplatelet                            agents. ASA Grade Assessment: II - A patient with  mild systemic disease. After reviewing the risks                            and benefits, the patient was deemed in                            satisfactory condition to undergo the procedure.                           After obtaining informed consent, the colonoscope                            was passed under direct vision. Throughout the                            procedure, the patient's blood pressure, pulse, and                             oxygen saturations were monitored continuously. The                            Olympus CF-HQ190L [id number]) Colonoscope was                            introduced through the anus and advanced to the the                            cecum, identified by appendiceal orifice and                            ileocecal valve. The colonoscopy was performed                            without difficulty. The patient tolerated the                            procedure well. The quality of the bowel                            preparation was excellent. The ileocecal valve,                            appendiceal orifice, and rectum were photographed. Scope In: 10:23:19 AM Scope Out: 10:32:27 AM Scope Withdrawal Time: 0 hours 6 minutes 29 seconds  Total Procedure Duration: 0 hours 9 minutes 8 seconds  Findings:                 The digital rectal exam findings include                            hypertrophied anal papilla at anal verge.                           Repeat examination of right colon under NBI  performed.                           There was evidence of a prior end-to-end                            colo-rectal anastomosis in the proximal rectum.                            This was patent and was characterized by healthy                            appearing mucosa.                           Anal papilla(e) were hypertrophied.                           The exam was otherwise without abnormality on                            direct and retroflexion views. Complications:            No immediate complications. Estimated Blood Loss:     Estimated blood loss: none. Impression:               - Hypertrophied anal papilla at anal verge found on                            digital rectal exam.                           - Patent end-to-end colo-rectal anastomosis,                            characterized by healthy appearing mucosa.                           - Anal papilla(e)  were hypertrophied. (similar                            appearance to last exam)                           - The examination was otherwise normal on direct                            and retroflexion views.                           - No specimens collected. Recommendation:           - Patient has a contact number available for                            emergencies. The signs and symptoms of potential  delayed complications were discussed with the                            patient. Return to normal activities tomorrow.                            Written discharge instructions were provided to the                            patient.                           - Resume previous diet.                           - Continue present medications.                           - Repeat colonoscopy in 3 years for surveillance.                           - Refer to a genetics counselor at appointment to                            be scheduled. Leelan Rajewski L. Loletha Carrow, MD 05/31/2022 10:38:29 AM This report has been signed electronically.

## 2022-05-31 NOTE — Progress Notes (Signed)
1028 Ephedrine 10 mg given IV due to low BP, MD updated.

## 2022-05-31 NOTE — Progress Notes (Signed)
History and Physical:  This patient presents for endoscopic testing for: Encounter Diagnosis  Name Primary?   Personal history of colon cancer, stage I Yes    This is a 51 year old woman here for surveillance colonoscopy.  In May 2022 she underwent colonoscopy for rectal bleeding, and was found to have 2 sigmoid polyps that were removed.  One was a tubular adenoma with high-grade dysplasia, the other had invasive adenocarcinoma.  She underwent sigmoid resection in June 2022 and surgical pathology confirmed stage I colorectal cancer.  She does not appear to have had genetic counseling.  Patient denies chronic abdominal pain, rectal bleeding, constipation or diarrhea.   Patient is otherwise without complaints or active issues today.   Past Medical History: Past Medical History:  Diagnosis Date   Cancer ([place])    Hemorrhoids    Hyperlipidemia      Past Surgical History: Past Surgical History:  Procedure Laterality Date   NO PAST SURGERIES     SIGMOIDECTOMY  2022    Allergies: No Known Allergies  Outpatient Meds: Current Outpatient Medications  Medication Sig Dispense Refill   dicyclomine (BENTYL) 20 MG tablet Take 1 tablet (20 mg total) by mouth 2 (two) times daily. (Patient not taking: Reported on 05/03/2022) 20 tablet 0   Ferrous Sulfate (IRON) 142 (45 Fe) MG TBCR Take 45 mg by mouth daily. (Patient not taking: Reported on 05/03/2022)     ibuprofen (ADVIL) 200 MG tablet Take 200 mg by mouth every 6 (six) hours as needed.     polyethylene glycol (MIRALAX) 17 g packet Take 17 g by mouth daily. (Patient not taking: Reported on 05/03/2022) 30 each 1   Pseudoephedrine-APAP-DM (DAYQUIL PO) Take 1 Dose by mouth daily as needed (congestion).     Tetrahydrozoline HCl (REDNESS RELIEVER EYE DROPS OP) Place 1 drop into both eyes daily as needed (redness).     traMADol (ULTRAM) 50 MG tablet Take 1-2 tablets (50-100 mg total) by mouth every 6 (six) hours as needed for moderate pain or severe  pain. (Patient not taking: Reported on 05/03/2022) 20 tablet 0   Current Facility-Administered Medications  Medication Dose Route Frequency Provider Last Rate Last Admin   0.9 %  sodium chloride infusion  500 mL Intravenous Once Nelida Meuse III, MD          ___________________________________________________________________ Objective   Exam:  BP 122/71   Pulse 63   Temp (!) 97.3 F (36.3 C)   Resp 14   Ht 5' (1.524 m)   Wt 121 lb 6.4 oz (55.1 kg)   LMP 11/27/2012   SpO2 99%   BMI 23.71 kg/m   CV: RRR without murmur, S1/S2 Resp: clear to auscultation bilaterally, normal RR and effort noted GI: soft, no tenderness, with active bowel sounds.   Assessment: Encounter Diagnosis  Name Primary?   Personal history of colon cancer, stage I Yes     Plan: Colonoscopy  The benefits and risks of the planned procedure were described in detail with the patient or (when appropriate) their health care proxy.  Risks were outlined as including, but not limited to, bleeding, infection, perforation, adverse medication reaction leading to cardiac or pulmonary decompensation, pancreatitis (if ERCP).  The limitation of incomplete mucosal visualization was also discussed.  No guarantees or warranties were given.   Referral for genetic counseling The patient is appropriate for an endoscopic procedure in the ambulatory setting.   - Wilfrid Lund, MD

## 2022-06-01 ENCOUNTER — Telehealth: Payer: Self-pay

## 2022-06-01 DIAGNOSIS — Z85038 Personal history of other malignant neoplasm of large intestine: Secondary | ICD-10-CM

## 2022-06-01 NOTE — Telephone Encounter (Signed)
Per 05/31/22 procedure report - Refer to a genetics counselor at appt to be scheduled  Called and spoke with patient's son, Eduard Clos. He is aware that we are placing a referral to genetics for the patient. He is aware that the genetics office is located in the [facility] building and to not be alarmed. Eduard Clos knows to expect a call to set up patient's appt. Charlie verbalized understanding and had no concerns at the end of the call.   Ambulatory referral to genetics in epic.

## 2022-06-01 NOTE — Telephone Encounter (Signed)
No voicemail set up and unable to leave message on follow up call. 

## 2022-06-02 ENCOUNTER — Telehealth: Payer: Self-pay | Admitting: Genetic Counselor

## 2022-06-15 ENCOUNTER — Encounter (HOSPITAL_COMMUNITY): Payer: Self-pay

## 2022-06-15 ENCOUNTER — Emergency Department (HOSPITAL_COMMUNITY)
Admission: EM | Admit: 2022-06-15 | Discharge: 2022-06-15 | Disposition: A | Discharge status: Home / Self Care | Payer: BC Managed Care – PPO | Attending: Emergency Medicine | Admitting: Emergency Medicine

## 2022-06-15 ENCOUNTER — Other Ambulatory Visit: Payer: Self-pay

## 2022-06-15 DIAGNOSIS — Z85038 Personal history of other malignant neoplasm of large intestine: Secondary | ICD-10-CM | POA: Diagnosis not present

## 2022-06-15 DIAGNOSIS — L509 Urticaria, unspecified: Secondary | ICD-10-CM | POA: Diagnosis not present

## 2022-06-15 DIAGNOSIS — R21 Rash and other nonspecific skin eruption: Secondary | ICD-10-CM | POA: Diagnosis present

## 2022-06-15 MED ORDER — CETIRIZINE HCL 10 MG PO TABS: 10.0000 mg | ORAL_TABLET | Freq: Every day | ORAL | 0 refills | Status: AC

## 2022-06-15 MED ORDER — DIPHENHYDRAMINE HCL 25 MG PO CAPS
25.0000 mg | ORAL_CAPSULE | Freq: Once | ORAL | Status: AC
  Administered 2022-06-15: 25 mg via ORAL
  Filled 2022-06-15: qty 1

## 2022-06-15 MED ORDER — FAMOTIDINE 20 MG PO TABS
20.0000 mg | ORAL_TABLET | Freq: Once | ORAL | Status: AC
Start: 2022-06-15 — End: 2022-06-15
  Administered 2022-06-15: 20 mg via ORAL
  Filled 2022-06-15: qty 1

## 2022-06-15 MED ORDER — DEXAMETHASONE SODIUM PHOSPHATE 10 MG/ML IJ SOLN
10.0000 mg | Freq: Once | INTRAMUSCULAR | Status: AC
  Administered 2022-06-15: 10 mg
  Filled 2022-06-15: qty 1

## 2022-06-15 MED ORDER — PREDNISONE 10 MG (21) PO TBPK: ORAL_TABLET | Freq: Every day | ORAL | 0 refills | Status: AC

## 2022-06-15 NOTE — ED Provider Notes (Signed)
[facility] DEPT Provider Note   CSN: [id number] Arrival date & time: 06/15/22  0431     History  Chief Complaint  Patient presents with   Rash    Gabrielle Robertson is a 51 y.o. female.  Patient as above with significant medical history as below, including colon cancer, hemorrhoids, HLD who presents to the ED with complaint of rash.  Patient reports she woke up around 2 AM with itchy rash to her right arm and shoulder.  She began scratching the rash and it has worsened.  He took a small amount of NyQuil which did not improve her symptoms because she was trying to sleep.  No swelling or rash to her face.  No difficulty swallowing or speaking.  No nausea or vomiting.  Denies known allergies.  No other household members with a similar rash.  No recent travel or sick contacts.  Denies history of similar complaints in the past.  No recent change in soaps, lotions or detergents.     Past Medical History:  Diagnosis Date   Cancer [facility])    Hemorrhoids    Hyperlipidemia     Past Surgical History:  Procedure Laterality Date   NO PAST SURGERIES     SIGMOIDECTOMY  2022     The history is provided by the patient and the spouse. The history is limited by a language barrier. No language interpreter was used.  Rash Associated symptoms: no abdominal pain, no fever, no headaches, no nausea, no shortness of breath and not vomiting        Home Medications Prior to Admission medications   Medication Sig Start Date End Date Taking? Authorizing Provider  cetirizine (ZYRTEC ALLERGY) 10 MG tablet Take 1 tablet (10 mg total) by mouth daily for 10 days. 06/16/22 06/26/22 Yes Jeanell Sparrow, DO  predniSONE (STERAPRED UNI-PAK 21 TAB) 10 MG (21) TBPK tablet Take by mouth daily. Take 6 tabs by mouth daily  for 2 days, then 5 tabs for 2 days, then 4 tabs for 2 days, then 3 tabs for 2 days, 2 tabs for 2 days, then 1 tab by mouth daily for 2 days 06/15/22  Yes Wynona Dove A, DO   dicyclomine (BENTYL) 20 MG tablet Take 1 tablet (20 mg total) by mouth 2 (two) times daily. Patient not taking: Reported on 05/03/2022 08/03/21   Maudie Flakes, MD  Ferrous Sulfate (IRON) 142 (45 Fe) MG TBCR Take 45 mg by mouth daily. Patient not taking: Reported on 05/03/2022    [provider]  ibuprofen (ADVIL) 200 MG tablet Take 200 mg by mouth every 6 (six) hours as needed.    [provider]  polyethylene glycol (MIRALAX) 17 g packet Take 17 g by mouth daily. Patient not taking: Reported on 05/03/2022 08/03/21   Maudie Flakes, MD  Pseudoephedrine-APAP-DM (DAYQUIL PO) Take 1 Dose by mouth daily as needed (congestion).    [provider]  Tetrahydrozoline HCl (REDNESS RELIEVER EYE DROPS OP) Place 1 drop into both eyes daily as needed (redness).    [provider]  traMADol (ULTRAM) 50 MG tablet Take 1-2 tablets (50-100 mg total) by mouth every 6 (six) hours as needed for moderate pain or severe pain. Patient not taking: Reported on 05/03/2022 06/05/21   Michael Boston, MD      Allergies    Patient has no known allergies.    Review of Systems   Review of Systems  Constitutional:  Negative for chills and fever.  HENT:  Negative for facial swelling and trouble swallowing.   Eyes:  Negative for photophobia and visual disturbance.  Respiratory:  Negative for cough and shortness of breath.   Cardiovascular:  Negative for chest pain and palpitations.  Gastrointestinal:  Negative for abdominal pain, nausea and vomiting.  Endocrine: Negative for polydipsia and polyuria.  Genitourinary:  Negative for difficulty urinating and hematuria.  Musculoskeletal:  Negative for gait problem and joint swelling.  Skin:  Positive for rash. Negative for pallor.  Neurological:  Negative for syncope and headaches.  Psychiatric/Behavioral:  Negative for agitation and confusion.     Physical Exam Updated Vital Signs BP 128/81   Pulse (!) 52   Temp 98.7 F (37.1 C) (Oral)    Resp 16   Ht 5' (1.524 m)   Wt 55.1 kg   LMP 11/27/2012   SpO2 98%   BMI 23.71 kg/m  Physical Exam Vitals and nursing note reviewed.  Constitutional:      General: She is not in acute distress.    Appearance: Normal appearance.  HENT:     Head: Normocephalic and atraumatic.     Jaw: There is normal jaw occlusion.     Comments: No angioedema  No drooling, stridor or trismus    Right Ear: External ear normal.     Left Ear: External ear normal.     Nose: Nose normal.     Mouth/Throat:     Mouth: Mucous membranes are moist.     Pharynx: Oropharynx is clear. Uvula midline. No uvula swelling.  Eyes:     General: No scleral icterus.       Right eye: No discharge.        Left eye: No discharge.  Cardiovascular:     Rate and Rhythm: Normal rate and regular rhythm.     Pulses: Normal pulses.     Heart sounds: Normal heart sounds.  Pulmonary:     Effort: Pulmonary effort is normal. No respiratory distress.     Breath sounds: Normal breath sounds.  Abdominal:     General: Abdomen is flat.     Tenderness: There is no abdominal tenderness.  Musculoskeletal:        General: Normal range of motion.     Cervical back: Normal range of motion.     Right lower leg: No edema.     Left lower leg: No edema.  Skin:    General: Skin is warm and dry.     Capillary Refill: Capillary refill takes less than 2 seconds.     Comments: Urticaria to right upper extremity proximally, across abdominal wall.  Does not include face, mouth.   Neurological:     Mental Status: She is alert and oriented to person, place, and time.     GCS: GCS eye subscore is 4. GCS verbal subscore is 5. GCS motor subscore is 6.  Psychiatric:        Mood and Affect: Mood normal.        Behavior: Behavior normal.     ED Results / Procedures / Treatments   Labs (all labs ordered are listed, but only abnormal results are displayed) Labs Reviewed - No data to display  EKG None  Radiology No results  found.  Procedures Procedures    Medications Ordered in ED Medications  diphenhydrAMINE (BENADRYL) capsule 25 mg (has no administration in time range)  diphenhydrAMINE (BENADRYL) capsule 25 mg (25 mg Oral Given 06/15/22 0524)  famotidine (PEPCID) tablet 20 mg (20  mg Oral Given 06/15/22 0524)  dexamethasone (DECADRON) injection 10 mg (10 mg Other Given 06/15/22 3154)    ED Course/ Medical Decision Making/ A&P                           Medical Decision Making Risk OTC drugs. Prescription drug management.    CC: rash  This patient presents to the Emergency Department for the above complaint. This involves an extensive number of treatment options and is a complaint that carries with it a high risk of complications and morbidity. Vital signs were reviewed. Serious etiologies considered.  DDx for rash is broad and includes common causes of atopic dermatitis, contact dermatitis, drug eruption, erythema multiforme, fifth disease, psoriasis, insect bites, eczema, pityriasis rosea, roseola, scabies, tinea corporis, urticaria, varicella, viral exanthem. Uncommon causes of rash include bullous pemphigoid, dermatitis herpetiformis, HIV acute exanthem, Kawasaki disease, lupus, Lyme disease, meningococcemia, mycosis fungoides, RMSF, scarlet fever, secondary syphilis, SSSS, SJS, and toxic shock syndrome.  Patient offered translation services but prefers to speak for herself.  Husband also at bedside helping to translate.  Record review:  Previous records obtained and reviewed prior ED visits, prior labs and imaging, prior office notes  Additional history obtained from spouse  Medical and surgical history as noted above.   Work up as above, notable for:  Labs & imaging results that were available during my care of the patient were visualized by me and considered in my medical decision making.  Physical exam as above.  Management: We will give steroids, Pepcid, Benadryl.  No evidence of  angioedema.  Doubt anaphylaxis.  ED Course:     Reassessment:  Patient ports feeling much better after intervention.  Hives have improved substantially.  Discussed avoidance of known triggers, using unscented soaps and lotions, loosefitting clothing, given prescription for Zyrtec, steroid taper.  Strict return precautions were discussed.  PCP follow-up.  No evidence of angioedema.  No airway involvement.  No nausea or vomiting.  No dysphonia or dysphagia.  Admission was considered.     The patient improved significantly and was discharged in stable condition. Detailed discussions were had with the patient regarding current findings, and need for close f/u with PCP or on call doctor. The patient has been instructed to return immediately if the symptoms worsen in any way for re-evaluation. Patient verbalized understanding and is in agreement with current care plan. All questions answered prior to discharge.             Social determinants of health include -  Social History   Socioeconomic History   Marital status: Married    Spouse name: Not on file   Number of children: Not on file   Years of education: Not on file   Highest education level: Not on file  Occupational History   Not on file  Tobacco Use   Smoking status: Never   Smokeless tobacco: Never  Vaping Use   Vaping Use: Never used  Substance and Sexual Activity   Alcohol use: No   Drug use: No   Sexual activity: Not Currently  Other Topics Concern   Not on file  Social History Narrative   Not on file   Social Determinants of Health   Financial Resource Strain: Not on file  Food Insecurity: Not on file  Transportation Needs: Not on file  Physical Activity: Not on file  Stress: Not on file  Social Connections: Not on file  Intimate Partner Violence:  Not on file      This chart was dictated using voice recognition software.  Despite best efforts to proofread,  errors can occur which can change the  documentation meaning.         Final Clinical Impression(s) / ED Diagnoses Final diagnoses:  Hives    Rx / DC Orders ED Discharge Orders          Ordered    cetirizine (ZYRTEC ALLERGY) 10 MG tablet  Daily        06/15/22 0608    predniSONE (STERAPRED UNI-PAK 21 TAB) 10 MG (21) TBPK tablet  Daily        06/15/22 0609              Jeanell Sparrow, DO 06/15/22 [phone]

## 2022-06-15 NOTE — ED Triage Notes (Signed)
Pt presents to ED from home with c/o itchy rash that developed tonight. Pt has rash on arm and spreading throughout torso. Pt also states rash has spread to groin area. Denies new food, SOB, N/V, or using any new products.

## 2022-06-15 NOTE — Discharge Instructions (Addendum)
It was a pleasure caring for you today in the emergency department. ° °Please return to the emergency department for any worsening or worrisome symptoms. ° ° °

## 2022-07-19 ENCOUNTER — Inpatient Hospital Stay: Payer: BC Managed Care – PPO | Attending: Genetic Counselor | Admitting: Genetic Counselor

## 2022-07-19 ENCOUNTER — Other Ambulatory Visit: Payer: Self-pay

## 2022-07-19 ENCOUNTER — Inpatient Hospital Stay: Payer: BC Managed Care – PPO

## 2022-07-19 ENCOUNTER — Encounter: Payer: Self-pay | Admitting: Genetic Counselor

## 2022-07-19 ENCOUNTER — Other Ambulatory Visit: Payer: Self-pay | Admitting: Genetic Counselor

## 2022-07-19 DIAGNOSIS — C187 Malignant neoplasm of sigmoid colon: Secondary | ICD-10-CM

## 2022-07-19 DIAGNOSIS — C800 Disseminated malignant neoplasm, unspecified: Secondary | ICD-10-CM | POA: Diagnosis not present

## 2022-07-19 DIAGNOSIS — C189 Malignant neoplasm of colon, unspecified: Secondary | ICD-10-CM | POA: Diagnosis not present

## 2022-07-19 DIAGNOSIS — Z800 Family history of malignant neoplasm of digestive organs: Secondary | ICD-10-CM | POA: Insufficient documentation

## 2022-07-19 LAB — GENETIC SCREENING ORDER

## 2022-07-19 NOTE — Progress Notes (Addendum)
REFERRING PROVIDER: Doran Stabler, MD [address] [place],  [place] [address]  PRIMARY PROVIDER:  System, Provider Not In  PRIMARY REASON FOR VISIT:  1. Family history of colon cancer   2. Malignant neoplasm of colon, unspecified part of colon ([place])      HISTORY OF PRESENT ILLNESS:   Ms. Gabrielle Robertson, a 51 y.o. female, was seen for a [place] cancer genetics consultation at the request of Dr. Loletha Carrow due to a personal and family history of colon cancer.  Ms. Dullea presents to clinic today to discuss the possibility of a hereditary predisposition to cancer, genetic testing, and to further clarify her future cancer risks, as well as potential cancer risks for family members.   In 2022, at the age of 46, Ms. Mangold was diagnosed with cancer of the colon. The treatment plan was surgery.    CANCER HISTORY:  Oncology History   No history exists.     RISK FACTORS:  Menarche was at age 82.  First live birth at age 76.  OCP use for approximately 0 years.  Ovaries intact: yes.  Hysterectomy: no.  Menopausal status: postmenopausal.  HRT use: 0 years. Colonoscopy: yes; abnormal. Mammogram within the last year: no. Number of breast biopsies: 0. Up to date with pelvic exams: no. Any excessive radiation exposure in the past: no  Past Medical History:  Diagnosis Date   Cancer ([place])    Family history of colon cancer    Hemorrhoids    Hyperlipidemia     Past Surgical History:  Procedure Laterality Date   NO PAST SURGERIES     SIGMOIDECTOMY  2022    Social History   Socioeconomic History   Marital status: Married    Spouse name: Not on file   Number of children: Not on file   Years of education: Not on file   Highest education level: Not on file  Occupational History   Not on file  Tobacco Use   Smoking status: Never   Smokeless tobacco: Never  Vaping Use   Vaping Use: Never used  Substance and Sexual Activity   Alcohol use: No   Drug use: No   Sexual  activity: Not Currently  Other Topics Concern   Not on file  Social History Narrative   Not on file   Social Determinants of Health   Financial Resource Strain: Not on file  Food Insecurity: Not on file  Transportation Needs: Not on file  Physical Activity: Not on file  Stress: Not on file  Social Connections: Not on file     FAMILY HISTORY:  We obtained a detailed, 4-generation family history.  Significant diagnoses are listed below: Family History  Problem Relation Age of Onset   Colon cancer Maternal Aunt    Colon polyps Neg Hx    Esophageal cancer Neg Hx    Rectal cancer Neg Hx    Stomach cancer Neg Hx       The patient has a son and two daughters who are cancer free.  She has three paternal half sisters and five brothers who are cancer free.  Both parents are deceased.  The patient's mother was killed by the [facility] when she was a young child.  Her mother had 11 siblings, one sister had colon cancer at 46.  There is no other reported family history of cancer.  The patient's father died at 67.  He had 5 siblings who were not reported to have cancer.  There is no other reported family history of cancer.  Ms. Abrigo is unaware of previous family history of genetic testing for hereditary cancer risks. Patient's maternal ancestors are of [place] descent, and paternal ancestors are of [place] descent. There is no reported Ashkenazi Jewish ancestry. There is no known consanguinity.  GENETIC COUNSELING ASSESSMENT: Ms. Hartsock is a 51 y.o. female with a personal and family history of colon cancer which is somewhat suggestive of a hereditary cancer syndrome and predisposition to cancer given her age of onset. We, therefore, discussed and recommended the following at today's visit.   DISCUSSION: We discussed that, in general, most cancer is not inherited in families, but instead is sporadic or familial. Sporadic cancers occur by chance and typically happen at older ages (>50  years) as this type of cancer is caused by genetic changes acquired during an individual's lifetime. Some families have more cancers than would be expected by chance; however, the ages or types of cancer are not consistent with a known genetic mutation or known genetic mutations have been ruled out. This type of familial cancer is thought to be due to a combination of multiple genetic, environmental, hormonal, and lifestyle factors. While this combination of factors likely increases the risk of cancer, the exact source of this risk is not currently identifiable or testable.  We discussed that 5 - 7% of colon cancer is hereditary, with most cases associated with Lynch syndrome.  There are other genes that can be associated with hereditary colon cancer syndromes.  These include APC, MUTYH and others.  We discussed that testing is beneficial for several reasons including knowing how to follow individuals after completing their treatment, identifying whether potential treatment options such as PARP inhibitors would be beneficial, and understand if other family members could be at risk for cancer and allow them to undergo genetic testing.   We discussed that some people do not want to undergo genetic testing due to fear of genetic discrimination.  A federal law called the Genetic Information Non-Discrimination Act (GINA) of 2008 helps protect individuals against genetic discrimination based on their genetic test results.  It impacts both health insurance and employment.  With health insurance, it protects against increased premiums, being kicked off insurance or being forced to take a test in order to be insured.  For employment it protects against hiring, firing and promoting decisions based on genetic test results.  Health status due to a cancer diagnosis is not protected under GINA.   We reviewed the characteristics, features and inheritance patterns of hereditary cancer syndromes. We also discussed genetic  testing, including the appropriate family members to test, the process of testing, insurance coverage and turn-around-time for results. We discussed the implications of a negative, positive, carrier and/or variant of uncertain significant result. We recommended Ms. Baumgarten pursue genetic testing for the CancerNext-Expanded+RNAinsight gene panel.   The CancerNext-Expanded gene panel offered by [facility] and includes sequencing and rearrangement analysis for the following 77 genes: AIP, ALK, APC*, ATM*, AXIN2, BAP1, BARD1, BLM, BMPR1A, BRCA1*, BRCA2*, BRIP1*, CDC73, CDH1*, CDK4, CDKN1B, CDKN2A, CHEK2*, CTNNA1, DICER1, FANCC, FH, FLCN, GALNT12, KIF1B, LZTR1, MAX, MEN1, MET, MLH1*, MSH2*, MSH3, MSH6*, MUTYH*, NBN, NF1*, NF2, NTHL1, PALB2*, PHOX2B, PMS2*, POT1, PRKAR1A, PTCH1, PTEN*, RAD51C*, RAD51D*, RB1, RECQL, RET, SDHA, SDHAF2, SDHB, SDHC, SDHD, SMAD4, SMARCA4, SMARCB1, SMARCE1, STK11, SUFU, TMEM127, TP53*, TSC1, TSC2, VHL and XRCC2 (sequencing and deletion/duplication); EGFR, EGLN1, HOXB13, KIT, MITF, PDGFRA, POLD1, and POLE (sequencing only); EPCAM and GREM1 (deletion/duplication only). DNA and RNA  analyses performed for * genes.   Based on Ms. Madani's personal and family history of cancer, she meets medical criteria for genetic testing. Despite that she meets criteria, she may still have an out of pocket cost. We discussed that if her out of pocket cost for testing is over $100, the laboratory will call and confirm whether she wants to proceed with testing.  If the out of pocket cost of testing is less than $100 she will be billed by the genetic testing laboratory.   PLAN: After considering the risks, benefits, and limitations, Ms. Dodgen provided informed consent to pursue genetic testing and the blood sample was sent to [facility] for analysis of the CancerNext-Expanded+RNAinsight. Results should be available within approximately 2-3 weeks' time, at which point they will be disclosed by  telephone to Ms. Staat, as will any additional recommendations warranted by these results. Ms. Mogel will receive a summary of her genetic counseling visit and a copy of her results once available. This information will also be available in Epic.   Lastly, we encouraged Ms. Chevalier to remain in contact with cancer genetics annually so that we can continuously update the family history and inform her of any changes in cancer genetics and testing that may be of benefit for this family.   Ms. Neer questions were answered to her satisfaction today. Our contact information was provided should additional questions or concerns arise. Thank you for the referral and allowing [place] to share in the care of your patient.   Jamillah Camilo P. Florene Glen, [place], [facility] Licensed, Insurance risk surveyor [email or web address] phone: [phone]  The patient was seen for a total of 53 minutes in face-to-face genetic counseling.  The patient brought her son This patient was discussed with Drs. Magrinat, Lindi Adie and/or Burr Medico who agrees with the above.    _______________________________________________________________________ For Office Staff:  Number of people involved in session: 2 Was an Intern/ student involved with case: no

## 2022-08-24 ENCOUNTER — Encounter: Payer: Self-pay | Admitting: Genetic Counselor

## 2022-08-24 ENCOUNTER — Telehealth: Payer: Self-pay | Admitting: Genetic Counselor

## 2022-08-24 DIAGNOSIS — Z1379 Encounter for other screening for genetic and chromosomal anomalies: Secondary | ICD-10-CM | POA: Insufficient documentation

## 2022-08-24 NOTE — Telephone Encounter (Signed)
I could not leave a message.  I will send a message through [place]

## 2022-08-28 ENCOUNTER — Telehealth: Payer: Self-pay | Admitting: Genetic Counselor

## 2022-08-28 ENCOUNTER — Encounter: Payer: Self-pay | Admitting: Genetic Counselor

## 2022-08-28 NOTE — Telephone Encounter (Signed)
VM has not been set up.  I will send a letter, as I am not able to send a message through [place].

## 2022-09-04 ENCOUNTER — Ambulatory Visit: Payer: Self-pay | Admitting: Genetic Counselor

## 2022-09-04 ENCOUNTER — Telehealth: Payer: Self-pay | Admitting: Genetic Counselor

## 2022-09-04 DIAGNOSIS — Z1379 Encounter for other screening for genetic and chromosomal anomalies: Secondary | ICD-10-CM

## 2022-09-04 NOTE — Telephone Encounter (Signed)
Spoke with the patient's son, Eduard Clos.  Revealed negative genetic testing.  Discussed that we do not know why she has colon cancer or why there is cancer in the family. It could be due to a different gene that we are not testing, or maybe our current technology may not be able to pick something up.  It will be important for her to keep in contact with genetics to keep up with whether additional testing may be needed.

## 2022-09-04 NOTE — Progress Notes (Signed)
HPI:  Ms. Virag was previously seen in the [place] clinic due to a personal and family history of colon cancer and concerns regarding a hereditary predisposition to cancer. Please refer to our prior cancer genetics clinic note for more information regarding our discussion, assessment and recommendations, at the time. Ms. Cortez recent genetic test results were disclosed to her, as were recommendations warranted by these results. These results and recommendations are discussed in more detail below.  CANCER HISTORY:  Oncology History  Colon cancer ([place])  06/02/2021 Initial Diagnosis   Colon cancer ([place])   08/18/2022 Genetic Testing   Negative genetic testing on the CancerNext-Expanded+RNainsight.  The report date is August 18, 2022.  The CancerNext-Expanded gene panel offered by [facility] and includes sequencing and rearrangement analysis for the following 77 genes: AIP, ALK, APC*, ATM*, AXIN2, BAP1, BARD1, BLM, BMPR1A, BRCA1*, BRCA2*, BRIP1*, CDC73, CDH1*, CDK4, CDKN1B, CDKN2A, CHEK2*, CTNNA1, DICER1, FANCC, FH, FLCN, GALNT12, KIF1B, LZTR1, MAX, MEN1, MET, MLH1*, MSH2*, MSH3, MSH6*, MUTYH*, NBN, NF1*, NF2, NTHL1, PALB2*, PHOX2B, PMS2*, POT1, PRKAR1A, PTCH1, PTEN*, RAD51C*, RAD51D*, RB1, RECQL, RET, SDHA, SDHAF2, SDHB, SDHC, SDHD, SMAD4, SMARCA4, SMARCB1, SMARCE1, STK11, SUFU, TMEM127, TP53*, TSC1, TSC2, VHL and XRCC2 (sequencing and deletion/duplication); EGFR, EGLN1, HOXB13, KIT, MITF, PDGFRA, POLD1, and POLE (sequencing only); EPCAM and GREM1 (deletion/duplication only). DNA and RNA analyses performed for * genes.      FAMILY HISTORY:  We obtained a detailed, 4-generation family history.  Significant diagnoses are listed below: Family History  Problem Relation Age of Onset   Colon cancer Maternal Aunt    Colon polyps Neg Hx    Esophageal cancer Neg Hx    Rectal cancer Neg Hx    Stomach cancer Neg Hx          The patient has a son and two daughters who are cancer  free.  She has three paternal half sisters and five brothers who are cancer free.  Both parents are deceased.   The patient's mother was killed by the [facility] when she was a young child.  Her mother had 11 siblings, one sister had colon cancer at 10.  There is no other reported family history of cancer.   The patient's father died at 20.  He had 5 siblings who were not reported to have cancer.  There is no other reported family history of cancer.   Ms. Piccirilli is unaware of previous family history of genetic testing for hereditary cancer risks. Patient's maternal ancestors are of [place] descent, and paternal ancestors are of [place] descent. There is no reported Ashkenazi Jewish ancestry. There is no known consanguinity  GENETIC TEST RESULTS: Genetic testing reported out on August 18, 2022 through the CancerNext-Expanded+RNAinsight cancer panel found no pathogenic mutations. The CancerNext-Expanded gene panel offered by [facility] and includes sequencing and rearrangement analysis for the following 77 genes: AIP, ALK, APC*, ATM*, AXIN2, BAP1, BARD1, BLM, BMPR1A, BRCA1*, BRCA2*, BRIP1*, CDC73, CDH1*, CDK4, CDKN1B, CDKN2A, CHEK2*, CTNNA1, DICER1, FANCC, FH, FLCN, GALNT12, KIF1B, LZTR1, MAX, MEN1, MET, MLH1*, MSH2*, MSH3, MSH6*, MUTYH*, NBN, NF1*, NF2, NTHL1, PALB2*, PHOX2B, PMS2*, POT1, PRKAR1A, PTCH1, PTEN*, RAD51C*, RAD51D*, RB1, RECQL, RET, SDHA, SDHAF2, SDHB, SDHC, SDHD, SMAD4, SMARCA4, SMARCB1, SMARCE1, STK11, SUFU, TMEM127, TP53*, TSC1, TSC2, VHL and XRCC2 (sequencing and deletion/duplication); EGFR, EGLN1, HOXB13, KIT, MITF, PDGFRA, POLD1, and POLE (sequencing only); EPCAM and GREM1 (deletion/duplication only). DNA and RNA analyses performed for * genes. The test report has been scanned into EPIC and is located under the  Molecular Pathology section of the Results Review tab.  A portion of the result report is included below for reference.     We discussed with Ms. Delano that because  current genetic testing is not perfect, it is possible there may be a gene mutation in one of these genes that current testing cannot detect, but that chance is small.  We also discussed, that there could be another gene that has not yet been discovered, or that we have not yet tested, that is responsible for the cancer diagnoses in the family. It is also possible there is a hereditary cause for the cancer in the family that Ms. Linhardt did not inherit and therefore was not identified in her testing.  Therefore, it is important to remain in touch with cancer genetics in the future so that we can continue to offer Ms. Richner the most up to date genetic testing.   ADDITIONAL GENETIC TESTING: We discussed with Ms. Dawe that her genetic testing was fairly extensive.  If there are genes identified to increase cancer risk that can be analyzed in the future, we would be happy to discuss and coordinate this testing at that time.    CANCER SCREENING RECOMMENDATIONS: Ms. Magnussen test result is considered negative (normal).  This means that we have not identified a hereditary cause for her personal and family history of colon cancer at this time. Most cancers happen by chance and this negative test suggests that her cancer may fall into this category.    While reassuring, this does not definitively rule out a hereditary predisposition to cancer. It is still possible that there could be genetic mutations that are undetectable by current technology. There could be genetic mutations in genes that have not been tested or identified to increase cancer risk.  Therefore, it is recommended she continue to follow the cancer management and screening guidelines provided by her oncology and primary healthcare provider.   An individual's cancer risk and medical management are not determined by genetic test results alone. Overall cancer risk assessment incorporates additional factors, including personal medical history, family  history, and any available genetic information that may result in a personalized plan for cancer prevention and surveillance  RECOMMENDATIONS FOR FAMILY MEMBERS:  Individuals in this family might be at some increased risk of developing cancer, over the general population risk, simply due to the family history of cancer.  We recommended women in this family have a yearly mammogram beginning at age 39, or 42 years younger than the earliest onset of cancer, an annual clinical breast exam, and perform monthly breast self-exams. Women in this family should also have a gynecological exam as recommended by their primary provider. All family members should be referred for colonoscopy starting at age 8.  FOLLOW-UP: Lastly, we discussed with Ms. Grzesiak that cancer genetics is a rapidly advancing field and it is possible that new genetic tests will be appropriate for her and/or her family members in the future. We encouraged her to remain in contact with cancer genetics on an annual basis so we can update her personal and family histories and let her know of advances in cancer genetics that may benefit this family.   Our contact number was provided. Ms. Slaven questions were answered to her satisfaction, and she knows she is welcome to call us at anytime with additional questions or concerns.   Roma Kayser, [place], [facility] Licensed, Certified Genetic Counselor [email or web address]

## 2023-04-29 IMAGING — CT CT HEAD W/O CM
3 series · 16 of 45 positions shown, 19 images · non-contrast
Comparison: No priors.

CLINICAL DATA: 50-year-old female with history of headache.

EXAM:
CT HEAD WITHOUT CONTRAST
TECHNIQUE: Contiguous axial images were obtained from the base of the skull
through the vertex without intravenous contrast.

[Series 2: head wo · axial · 0.47mm/px · z∈[-139,-24]mm · 10 of 28 slices shown, 13 images]
[im 3/28  brain]
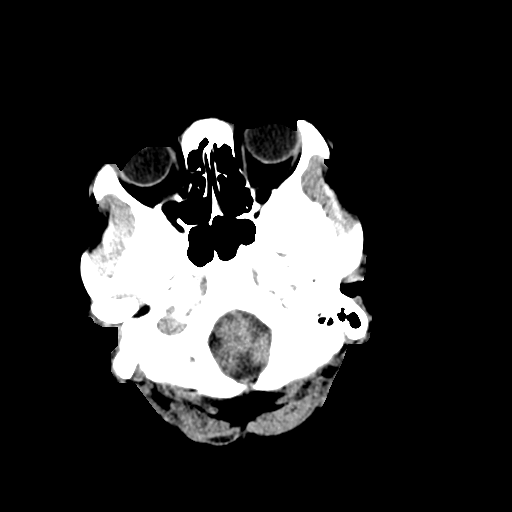
[im 3/28  bone]
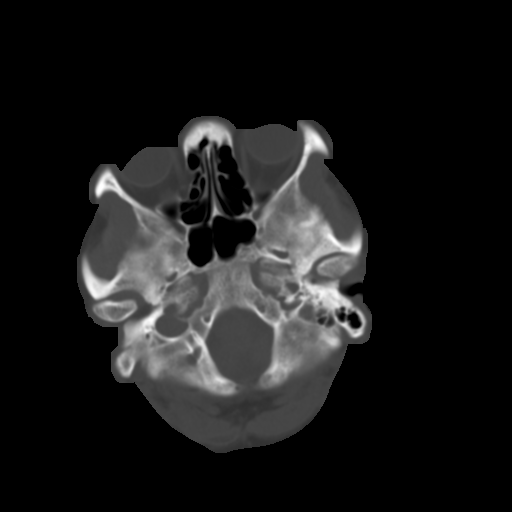
[im 5/28  brain]
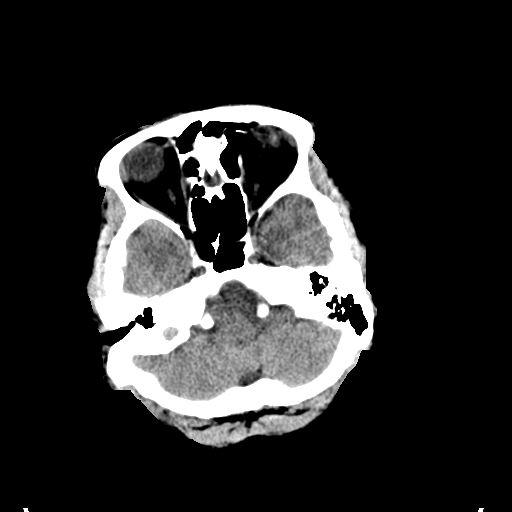
[im 8/28  brain]
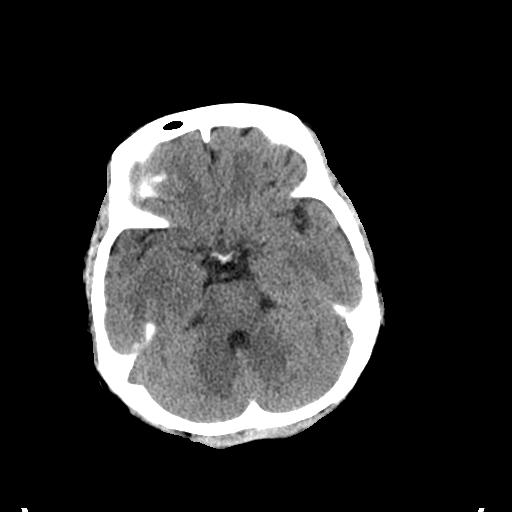
[im 11/28  brain]
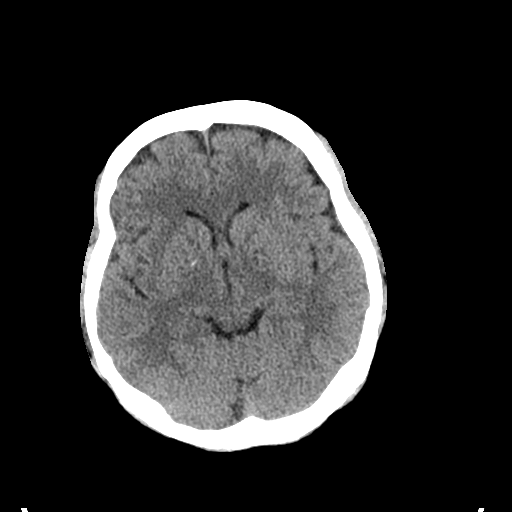
[im 13/28  brain]
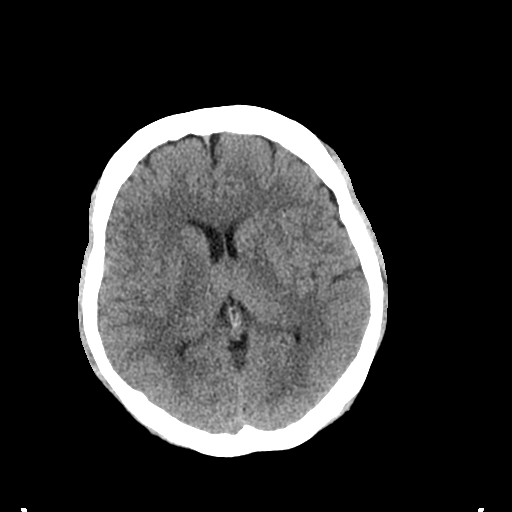
[im 13/28  bone]
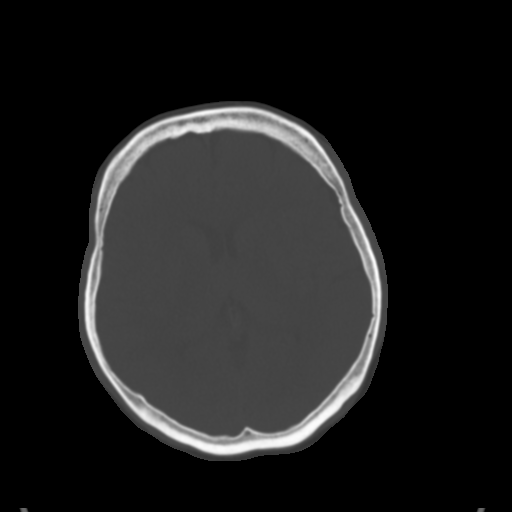
[im 16/28  brain]
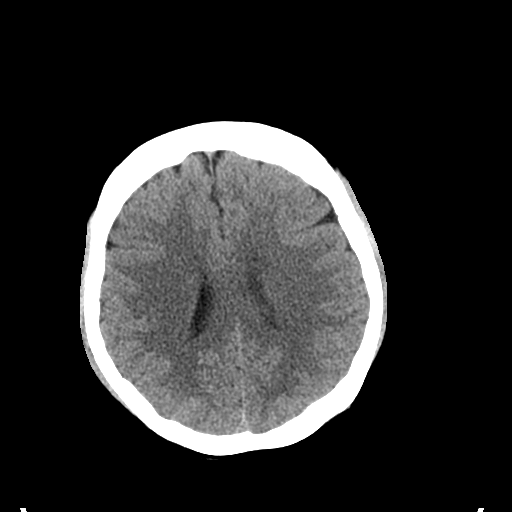
[im 18/28  brain]
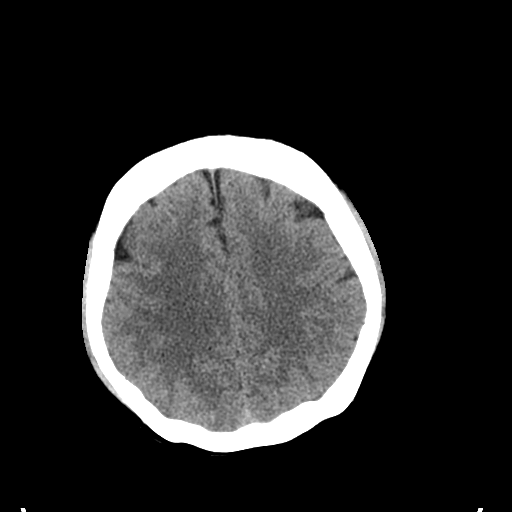
[im 21/28  brain]
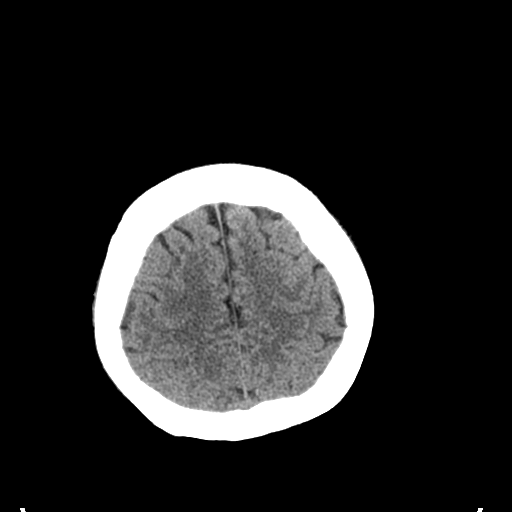
[im 24/28  brain]
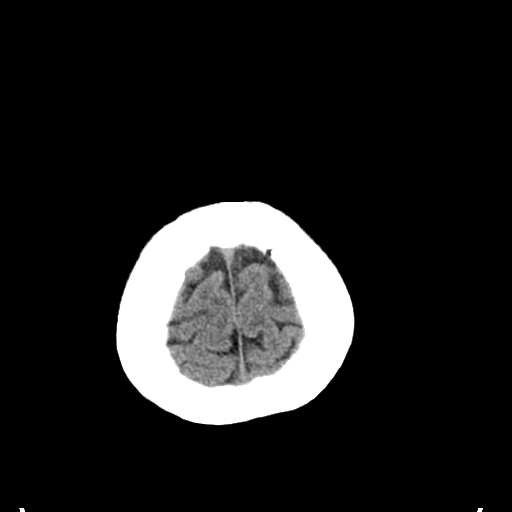
[im 24/28  bone]
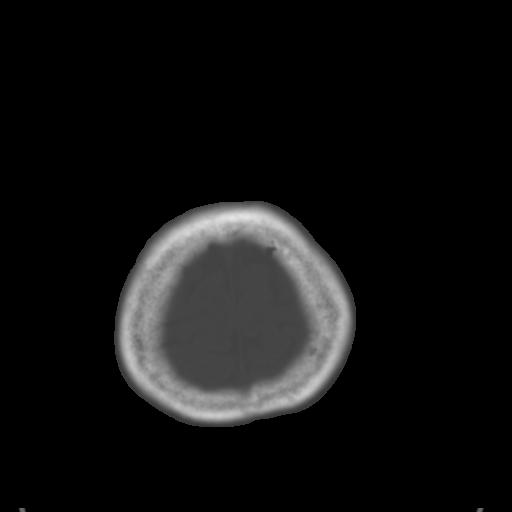
[im 26/28  brain]
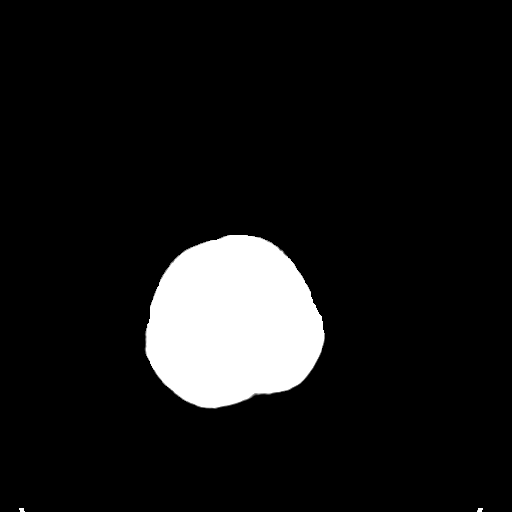

[Series 4: coronal soft tissue · coronal · 0.28mm/px · 3 of 57 slices shown]
[im 19/57  brain]
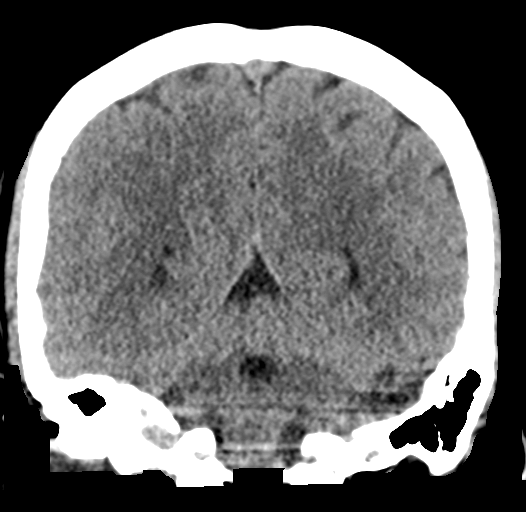
[im 25/57  brain]
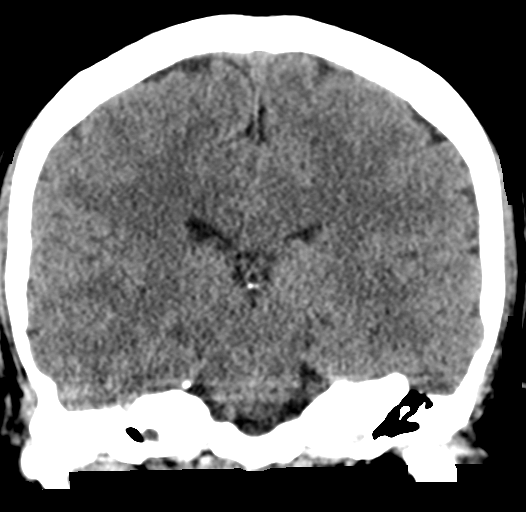
[im 32/57  brain]
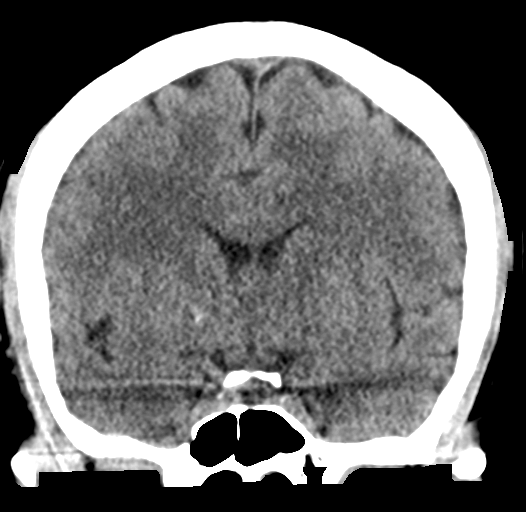

[Series 5: sagittal soft tissue · sagittal · 0.28mm/px · 3 of 51 slices shown]
[im 17/51  brain]
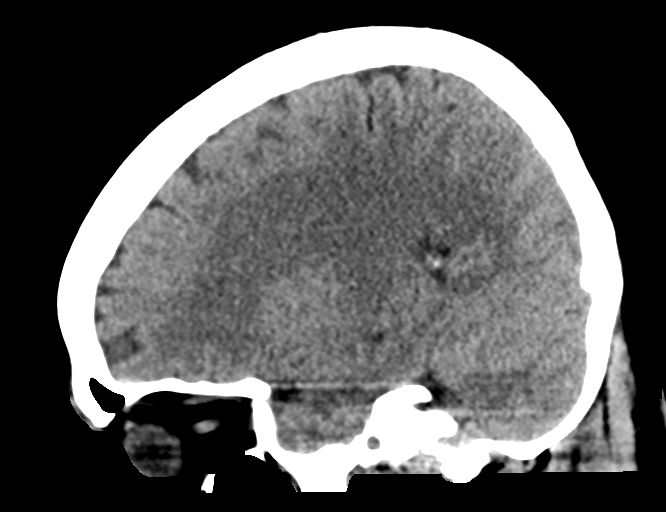
[im 26/51  brain]
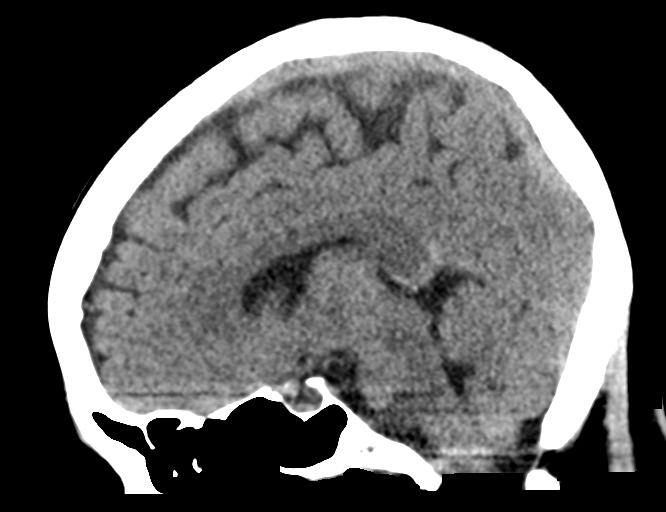
[im 34/51  brain]
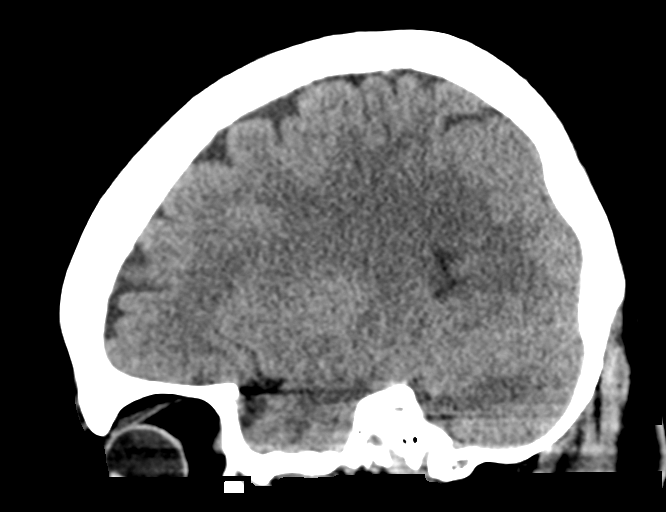

[16 of 45 positions shown; findings below may reference images not displayed]

FINDINGS: Brain: Small focus of high attenuation in the medial aspect of the
right globus pallidus appears most consistent with a small focus of
physiologic calcification. No evidence of acute infarction,
hemorrhage, hydrocephalus, extra-axial collection or mass
lesion/mass effect.

Vascular: No hyperdense vessel. Small vascular calcification in the
right basal ganglia.

Skull: Normal. Negative for fracture or focal lesion.

Sinuses/Orbits: No acute finding.

Other: None.
IMPRESSION: 1. No acute intracranial abnormalities.

## 2023-04-29 IMAGING — CT CT ABD-PELV W/ CM
2 of 4 series · 15 of 46 positions shown, 17 images · IV contrast (omnipaque)
Comparison: CT of the abdomen and pelvis 06/05/2021.

CLINICAL DATA: 50-year-old female with history of left lower
quadrant abdominal pain following recent sigmoidoscopy.

EXAM:
CT ABDOMEN AND PELVIS WITH CONTRAST
TECHNIQUE: Multidetector CT imaging of the abdomen and pelvis was performed
using the standard protocol following bolus administration of
intravenous contrast.
CONTRAST:  70mL OMNIPAQUE IOHEXOL 350 MG/ML SOLN

[Series 2: axial st · axial · 0.68mm/px · z∈[-865,-465]mm · 12 of 88 slices shown, 14 images]
[im 4/88  soft-tissue]
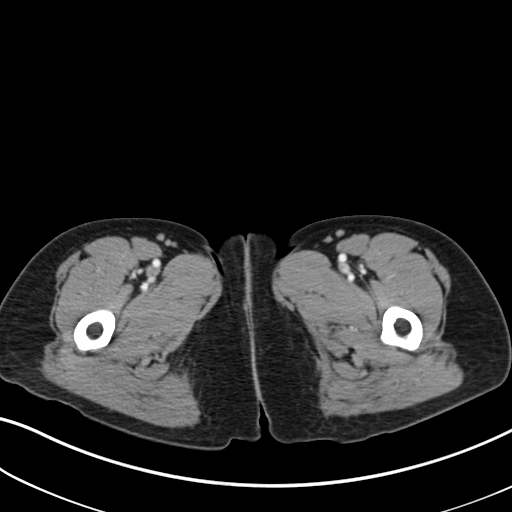
[im 4/88  bone]
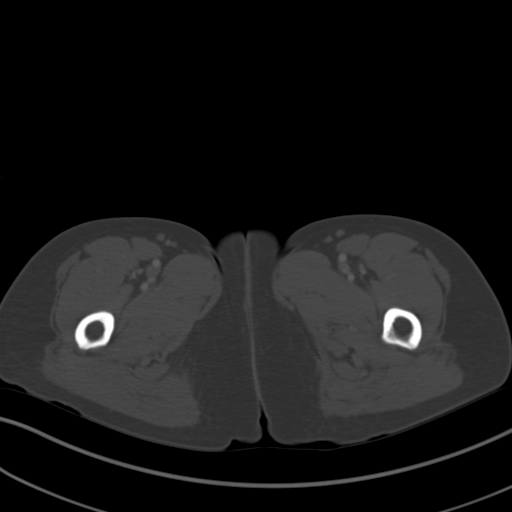
[im 12/88  soft-tissue]
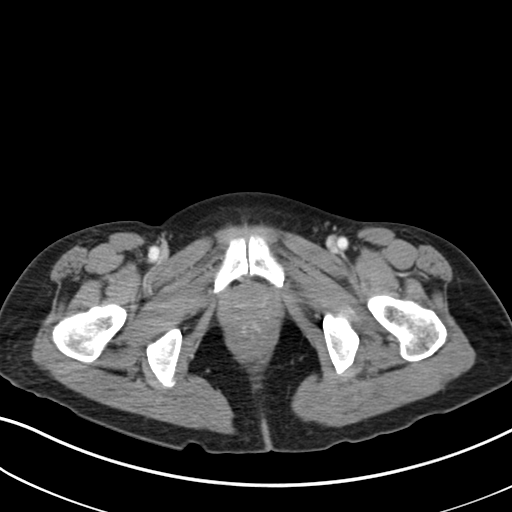
[im 19/88  soft-tissue]
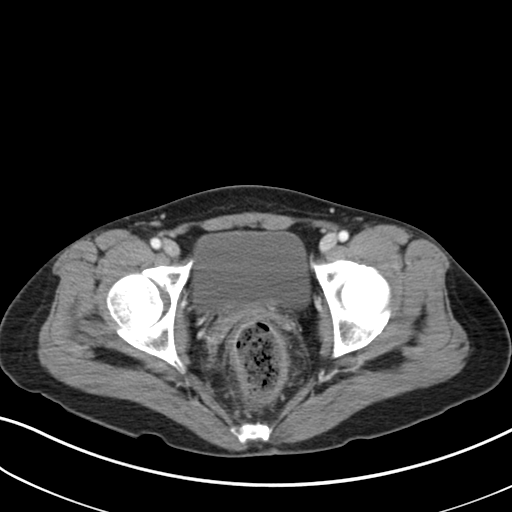
[im 27/88  soft-tissue]
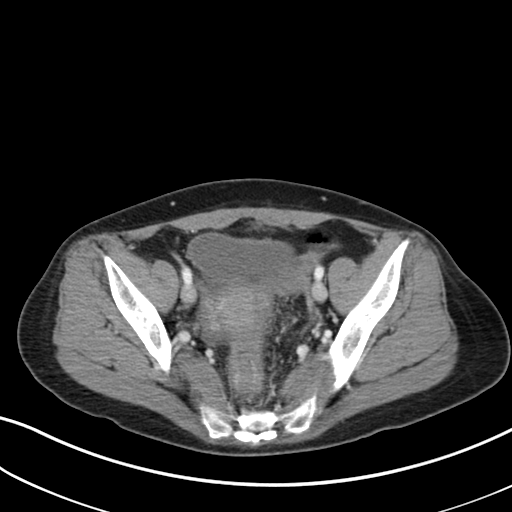
[im 35/88  soft-tissue]
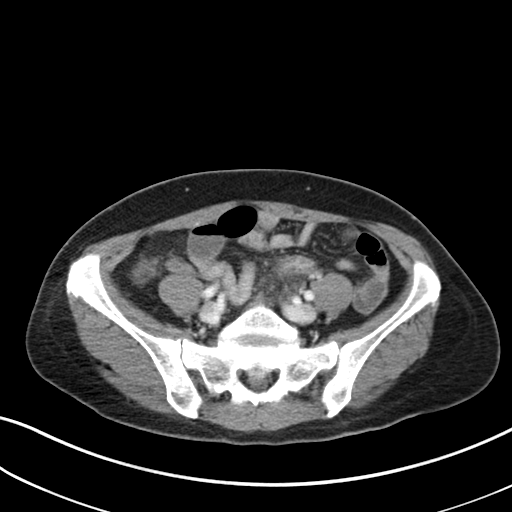
[im 42/88  soft-tissue]
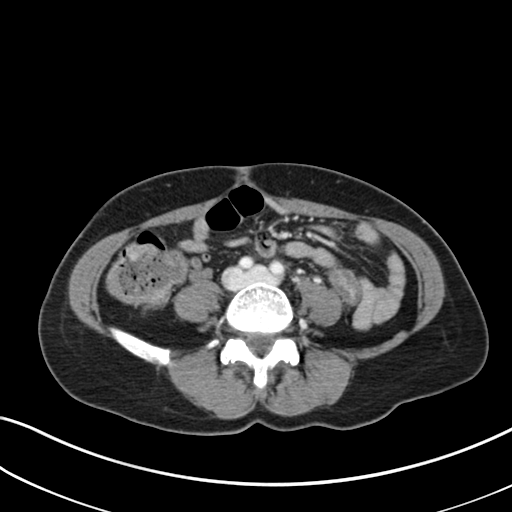
[im 46/88  soft-tissue]
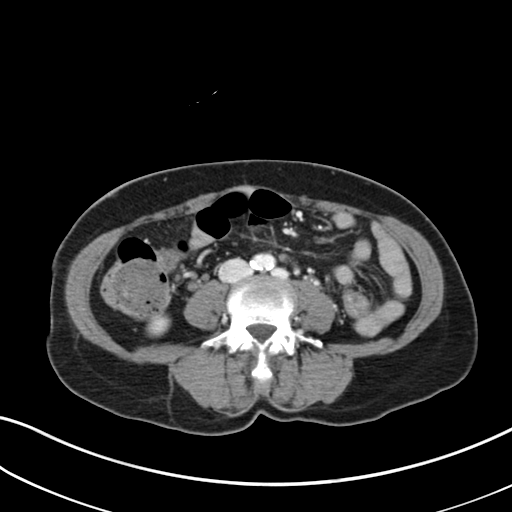
[im 53/88  soft-tissue]
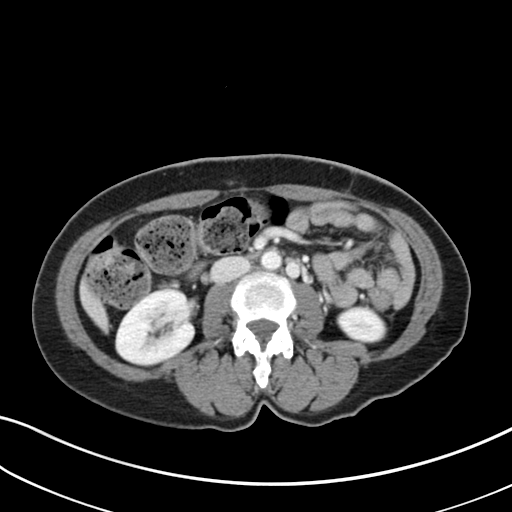
[im 61/88  soft-tissue]
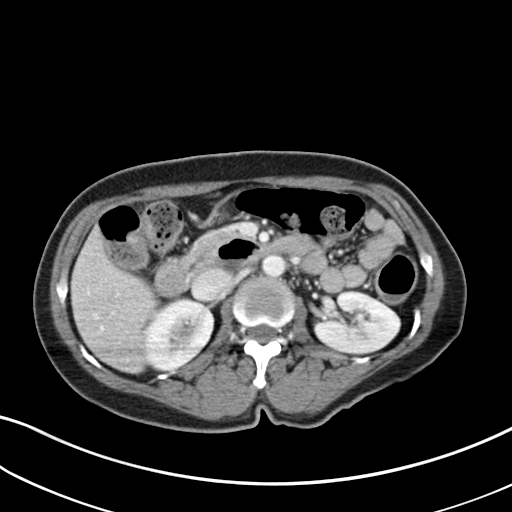
[im 61/88  bone]
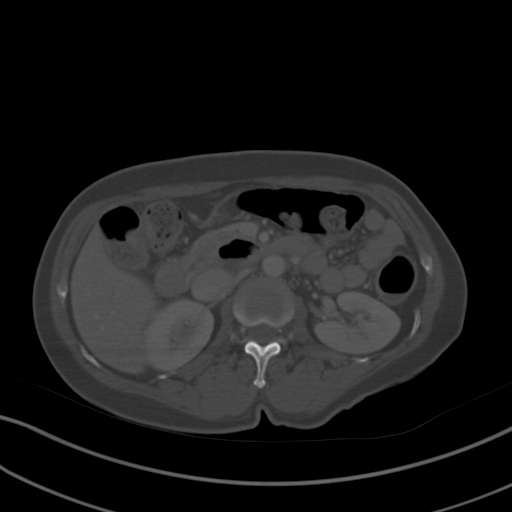
[im 69/88  soft-tissue]
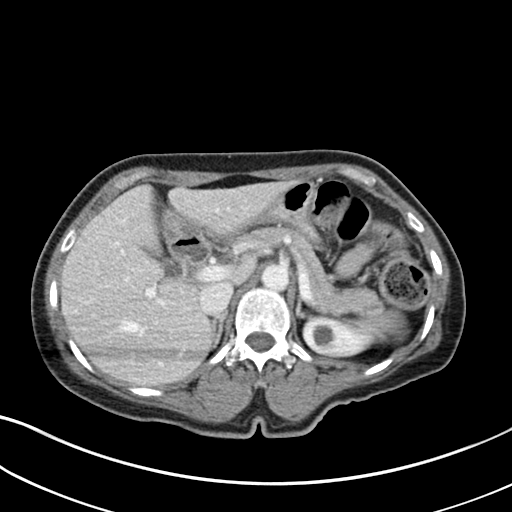
[im 76/88  soft-tissue]
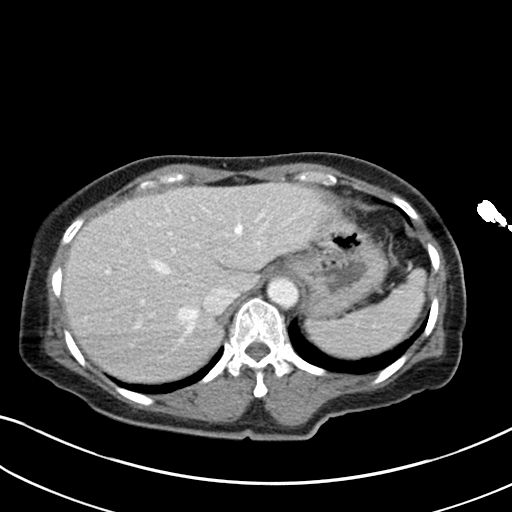
[im 84/88  soft-tissue]
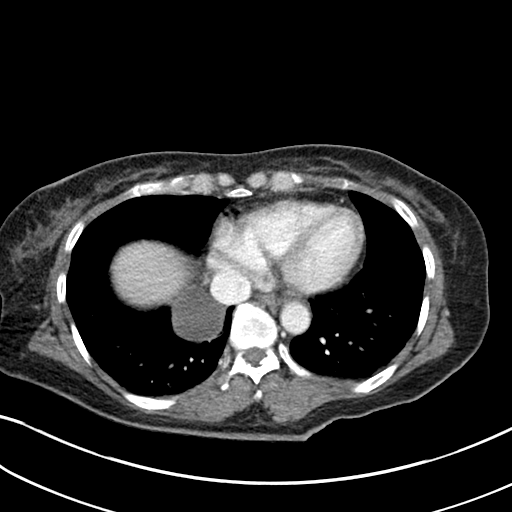

[Series 4: coronal st · coronal · 0.57mm/px · 3 of 102 slices shown]
[im 34/102  soft-tissue]
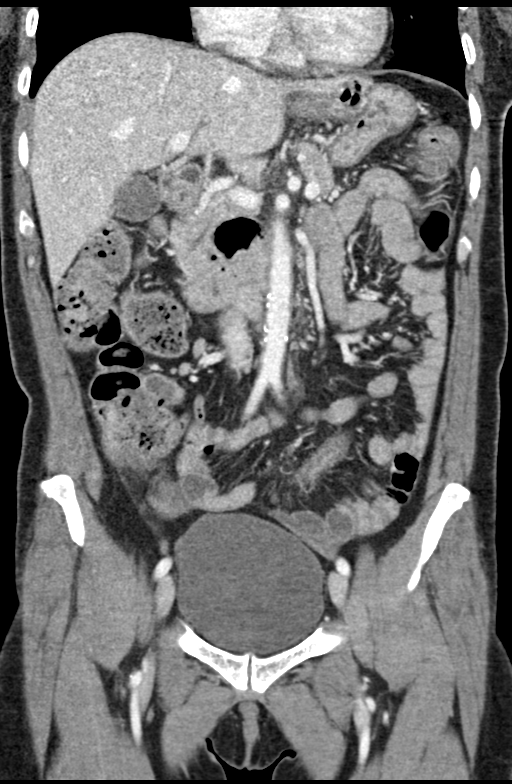
[im 45/102  soft-tissue]
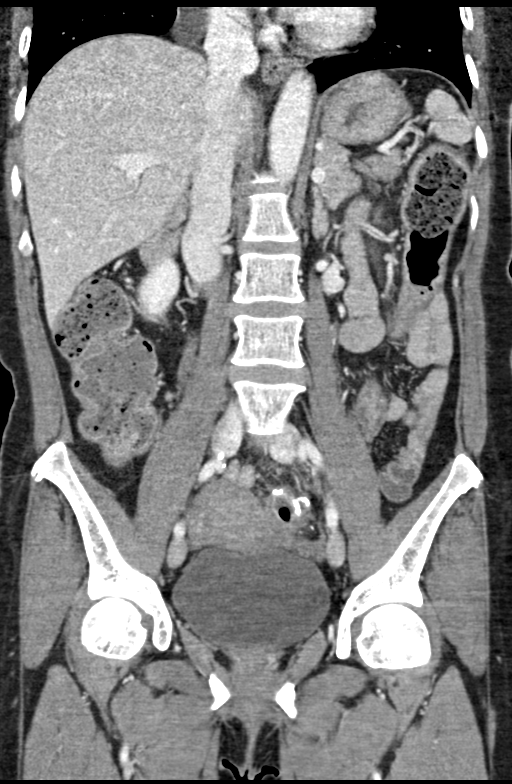
[im 57/102  soft-tissue]
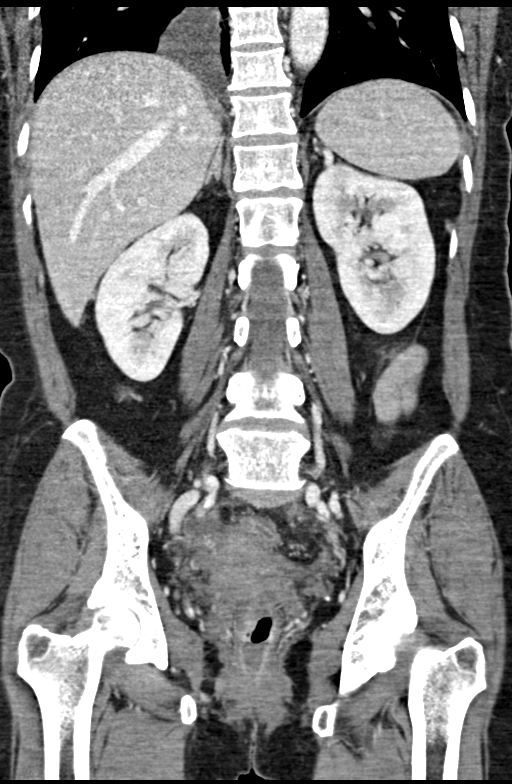

[15 of 46 positions shown; findings below may reference images not displayed]

FINDINGS: Lower chest: Again noted is a well-circumscribed low-attenuation
lesion in the medial aspect of the lower right hemithorax intimately
associated with the posterior aspect of the right heart border,
inferior vena cava and right hemidiaphragm, stable compared to prior
studies measuring approximately 3.7 x 3.7 cm, likely to represent a
pericardial cyst or less likely a bronchogenic cyst.

Hepatobiliary: No suspicious cystic or solid hepatic lesions. No
intra or extrahepatic biliary ductal dilatation. Gallbladder is
normal in appearance.

Pancreas: No pancreatic mass. No pancreatic ductal dilatation. No
pancreatic or peripancreatic fluid collections or inflammatory
changes.

Spleen: Unremarkable.

Adrenals/Urinary Tract: 1.4 cm low-attenuation lesion in the upper
pole of the left kidney compatible with a simple cyst. Other
subcentimeter low-attenuation lesions in both kidneys, too small to
characterize, but statistically likely to represent tiny cysts. No
hydroureteronephrosis. Urinary bladder is normal in appearance.
Bilateral adrenal glands are normal in appearance.

Stomach/Bowel: The appearance of the stomach is normal. There is no
pathologic dilatation of small bowel or colon. Status post partial
sigmoidectomy. No unexpected soft tissue mass, fluid collection or
gas adjacent to the anastomotic suture line in the pelvis. Normal
appendix.

Vascular/Lymphatic: Aortic atherosclerosis, without evidence of
aneurysm or dissection in the abdominal or pelvic vasculature.
Retroaortic left renal vein (normal anatomical variant) incidentally
noted. No lymphadenopathy noted in the abdomen or pelvis.

Reproductive: Uterus and ovaries are unremarkable in appearance.

Other: No significant volume of ascites.  No pneumoperitoneum.

Musculoskeletal: There are no aggressive appearing lytic or blastic
lesions noted in the visualized portions of the skeleton.
IMPRESSION: 1. No acute findings are noted in the abdomen or pelvis to account
for the patient's symptoms.
2. Status post partial sigmoidectomy. No definite signs of
metastatic disease noted in the abdomen or pelvis.
3. Aortic atherosclerosis.
4. Additional incidental findings, as above.
